# Patient Record
Sex: Female | Born: 1970
Health system: Southern US, Community
[De-identification: ages and names within clinical notes are randomized; demographics above are authoritative.]

## PROBLEM LIST (undated history)

## (undated) DIAGNOSIS — Z8742 Personal history of other diseases of the female genital tract: Secondary | ICD-10-CM

## (undated) DIAGNOSIS — N390 Urinary tract infection, site not specified: Secondary | ICD-10-CM

## (undated) DIAGNOSIS — R7303 Prediabetes: Secondary | ICD-10-CM

## (undated) DIAGNOSIS — T7840XA Allergy, unspecified, initial encounter: Secondary | ICD-10-CM

## (undated) DIAGNOSIS — M7540 Impingement syndrome of unspecified shoulder: Secondary | ICD-10-CM

## (undated) DIAGNOSIS — Z87440 Personal history of urinary (tract) infections: Secondary | ICD-10-CM

## (undated) DIAGNOSIS — E781 Pure hyperglyceridemia: Secondary | ICD-10-CM

## (undated) DIAGNOSIS — K219 Gastro-esophageal reflux disease without esophagitis: Secondary | ICD-10-CM

## (undated) DIAGNOSIS — G43909 Migraine, unspecified, not intractable, without status migrainosus: Secondary | ICD-10-CM

## (undated) DIAGNOSIS — U071 COVID-19: Secondary | ICD-10-CM

## (undated) DIAGNOSIS — E790 Hyperuricemia without signs of inflammatory arthritis and tophaceous disease: Secondary | ICD-10-CM

## (undated) DIAGNOSIS — Z9289 Personal history of other medical treatment: Secondary | ICD-10-CM

## (undated) HISTORY — DX: Personal history of other diseases of the female genital tract: Z87.42

## (undated) HISTORY — DX: Allergy, unspecified, initial encounter: T78.40XA

## (undated) HISTORY — DX: Personal history of other medical treatment: Z92.89

## (undated) HISTORY — PX: INTRAUTERINE DEVICE (IUD) INSERTION: SHX5877

## (undated) HISTORY — PX: COLONOSCOPY: SHX174

## (undated) HISTORY — DX: Urinary tract infection, site not specified: N39.0

## (undated) HISTORY — DX: Migraine, unspecified, not intractable, without status migrainosus: G43.909

---

## 1998-03-28 HISTORY — PX: SEPTOPLASTY: SUR1290

## 2004-12-10 ENCOUNTER — Ambulatory Visit: Payer: Self-pay | Admitting: Family Medicine

## 2006-09-01 ENCOUNTER — Ambulatory Visit: Payer: Self-pay | Admitting: Obstetrics and Gynecology

## 2008-05-06 ENCOUNTER — Ambulatory Visit: Payer: Self-pay | Admitting: Urology

## 2008-05-08 ENCOUNTER — Ambulatory Visit: Payer: Self-pay | Admitting: Urology

## 2009-08-19 ENCOUNTER — Ambulatory Visit: Payer: Self-pay

## 2009-10-09 ENCOUNTER — Other Ambulatory Visit: Admission: RE | Admit: 2009-10-09 | Discharge: 2009-10-09 | Payer: Self-pay | Admitting: Obstetrics and Gynecology

## 2010-12-31 ENCOUNTER — Ambulatory Visit: Payer: Self-pay | Admitting: Gastroenterology

## 2011-03-29 LAB — HM COLONOSCOPY

## 2011-09-09 ENCOUNTER — Ambulatory Visit: Payer: Self-pay | Admitting: General Practice

## 2011-09-26 ENCOUNTER — Ambulatory Visit: Payer: Self-pay | Admitting: General Practice

## 2011-12-02 HISTORY — PX: COLPOSCOPY: SHX161

## 2012-06-08 ENCOUNTER — Ambulatory Visit: Payer: Self-pay | Admitting: Obstetrics and Gynecology

## 2012-06-12 ENCOUNTER — Ambulatory Visit: Payer: Self-pay | Admitting: Obstetrics and Gynecology

## 2012-09-25 LAB — HM MAMMOGRAPHY

## 2012-09-25 LAB — HM PAP SMEAR

## 2013-03-29 ENCOUNTER — Encounter (INDEPENDENT_AMBULATORY_CARE_PROVIDER_SITE_OTHER): Payer: Self-pay

## 2013-03-29 ENCOUNTER — Ambulatory Visit (INDEPENDENT_AMBULATORY_CARE_PROVIDER_SITE_OTHER): Payer: 59 | Admitting: Adult Health

## 2013-03-29 ENCOUNTER — Encounter: Payer: Self-pay | Admitting: Adult Health

## 2013-03-29 VITALS — BP 116/72 | HR 74 | Temp 98.3°F | Resp 12 | Ht 68.5 in | Wt 254.0 lb

## 2013-03-29 DIAGNOSIS — Z0001 Encounter for general adult medical examination with abnormal findings: Secondary | ICD-10-CM | POA: Insufficient documentation

## 2013-03-29 DIAGNOSIS — K219 Gastro-esophageal reflux disease without esophagitis: Secondary | ICD-10-CM | POA: Insufficient documentation

## 2013-03-29 DIAGNOSIS — R519 Headache, unspecified: Secondary | ICD-10-CM | POA: Insufficient documentation

## 2013-03-29 DIAGNOSIS — Z0000 Encounter for general adult medical examination without abnormal findings: Secondary | ICD-10-CM | POA: Insufficient documentation

## 2013-03-29 DIAGNOSIS — R51 Headache: Secondary | ICD-10-CM

## 2013-03-29 DIAGNOSIS — Z87440 Personal history of urinary (tract) infections: Secondary | ICD-10-CM

## 2013-03-29 NOTE — Assessment & Plan Note (Signed)
Patient has been seen by urology. She is currently on Macrobid 100 mg when necessary. No symptoms at current time. Continue to follow

## 2013-03-29 NOTE — Assessment & Plan Note (Signed)
Migraine-like headache. Describes vice-like pressure, photophobia, nausea. Reports headaches began in early 103s. Mother with  similar history. Patient reports less than 5 episodes. She has treated these with over-the-counter medications such as Excedrin Migraine, ibuprofen, and Goody powders. Patient has not had MRI in the past. She would like to hold off on having this done at the present time since she really does not have frequent symptoms. Suggested keeping a food diary when headaches occur to see if she can have a correlation between foods as triggers. Also may be hormonal related. Continue to follow

## 2013-03-29 NOTE — Patient Instructions (Signed)
.   Thank you for choosing [facility] for your health care needs.  Please have your labs drawn at your earliest convenience. You will need to be fasting prior. You may drink water.  The results will be available through MyChart for your convenience. Please remember to activate this. The activation code is located at the end of this form.  I will see you back in 1 year for your physical or sooner if necessary.

## 2013-03-29 NOTE — Assessment & Plan Note (Signed)
Well controlled with omeprazole 40 mg daily. Occasionally she takes tums. Continue to follow

## 2013-03-29 NOTE — Assessment & Plan Note (Signed)
Normal physical exam. Check labs : CBC with differential, comprehensive metabolic panel, lipids, TSH, vitamin D, B12. She will have labs drawn at work. Request medical records from previous PCP. Patient was followed by Dr. Lovie Macadamia at [facility].

## 2013-03-29 NOTE — Progress Notes (Signed)
Pre visit review using our clinic review tool, if applicable. No additional management support is needed unless otherwise documented below in the visit note. 

## 2013-03-29 NOTE — Progress Notes (Signed)
Subjective:    Patient ID: Susan Bentley, female    DOB: September 24, 1970, 43 y.o.   MRN: [id number]  HPI  Susan Bentley is a pleasant 43 y/o female who presents to clinic to establish care. She was previously followed by Dr. Lovie Macadamia at [facility]. Will request records. Overall, patient is feeling well.  Hx of Migraine-like headache. Describes vice-like pressure, photophobia, nausea. Reports headaches began in early 86s. Mother with  similar history. Patient reports less than 5 episodes total. She has treated these with over-the-counter medications such as Excedrin Migraine, ibuprofen, and Goody powders.   Hx of frequent UTIs. Followed in the past by urology with workup done. Macrodantin ordered prn.    Past Medical History  Diagnosis Date  . Allergy     Claritin and Flonas helps manage symptoms  . Migraine   . UTI (lower urinary tract infection)     Seen by urology. Macrobid prn     Past Surgical History  Procedure Laterality Date  . Septoplasty  2000     Family History  Problem Relation Age of Onset  . Cancer Maternal Grandmother 36    colon cancer, breast cancer  . Hyperlipidemia Paternal Grandmother   . Hypertension Paternal Grandmother   . Heart disease Paternal Grandmother   . Cancer Paternal Grandmother   . Hyperlipidemia Paternal Grandfather   . Stroke Paternal Grandfather   . Hypertension Paternal Grandfather   . Aneurysm Paternal Grandfather   . Hypothyroidism Father   . Hypothyroidism Sister      History   Social History  . Marital Status: Married    Spouse Name: Susan Bentley    Number of Children: 2  . Years of Education: 16   Occupational History  . RN - [employer]   Social History Main Topics  . Smoking status: Never Smoker   . Smokeless tobacco: Never Used  . Alcohol Use: No  . Drug Use: No  . Sexual Activity: Yes   Other Topics Concern  . Not on file   Social History Narrative   Susan Bentley grew up in [place]. She lives at home with  her husband, Susan Bentley, of 3 years. Her youngest son, Susan Bentley, is a Paramedic in [employer] and lives at home with them. [name] has another son, Susan Bentley, who is away at college at [employer]. [name] attended [employer] and obtained her Bachelors in Nursing. 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 She also enjoys gardening in the summer. She also quilts.     Review of Systems  Constitutional: Negative.   HENT: Negative.   Eyes: Negative.   Respiratory: Negative.   Cardiovascular: Negative.   Gastrointestinal: Negative.   Endocrine: Negative.   Genitourinary: Negative.   Musculoskeletal: Negative.   Skin: Negative.   Allergic/Immunologic: Negative.   Neurological: Negative.   Hematological: Negative.   Psychiatric/Behavioral: Negative.        Objective:   Physical Exam  Constitutional: She is oriented to person, place, and time. She appears well-developed and well-nourished. No distress.  HENT:  Head: Normocephalic and atraumatic.  Right Ear: External ear normal.  Left Ear: External ear normal.  Nose: Nose normal.  Mouth/Throat: Oropharynx is clear and moist.  Eyes: Conjunctivae and EOM are normal. Pupils are equal, round, and reactive to light.  Neck: Normal range of motion. Neck supple. No tracheal deviation present. No thyromegaly present.  Cardiovascular: Normal rate, regular rhythm, normal heart sounds and intact distal  pulses.  Exam reveals no gallop and no friction rub.   No murmur heard. Pulmonary/Chest: Effort normal and breath sounds normal. No respiratory distress. She has no wheezes. She has no rales.  Abdominal: Soft. Bowel sounds are normal. She exhibits no distension and no mass. There is no tenderness. There is no rebound and no guarding.  Genitourinary:  Deferred. Followed by [facility]  Musculoskeletal: Normal range of motion. She exhibits no edema and no tenderness.  Lymphadenopathy:    She has no cervical adenopathy.    Neurological: She is alert and oriented to person, place, and time. She has normal reflexes. No cranial nerve deficit. Coordination normal.  Skin: Skin is warm and dry.  Psychiatric: She has a normal mood and affect. Her behavior is normal. Judgment and thought content normal.    BP 116/72  Pulse 74  Temp(Src) 98.3 F (36.8 C) (Oral)  Resp 12  Ht 5' 8.5" (1.74 m)  Wt 254 lb (115.214 kg)  BMI 38.05 kg/m2  SpO2 98%        Assessment & Plan:

## 2013-04-02 ENCOUNTER — Other Ambulatory Visit: Payer: Self-pay | Admitting: Adult Health

## 2013-04-16 LAB — COMPREHENSIVE METABOLIC PANEL
ALT: 18 IU/L (ref 0–32)
AST: 16 IU/L (ref 0–40)
Albumin/Globulin Ratio: 1.7 (ref 1.1–2.5)
Albumin: 4.1 g/dL (ref 3.5–5.5)
Alkaline Phosphatase: 91 IU/L (ref 39–117)
BUN/Creatinine Ratio: 19 (ref 9–23)
BUN: 14 mg/dL (ref 6–24)
CALCIUM: 9.3 mg/dL (ref 8.7–10.2)
CHLORIDE: 103 mmol/L (ref 97–108)
CO2: 17 mmol/L — AB (ref 18–29)
Creatinine, Ser: 0.72 mg/dL (ref 0.57–1.00)
GFR calc Af Amer: 119 mL/min/{1.73_m2} (ref >59)
GFR calc non Af Amer: 103 mL/min/{1.73_m2} (ref >59)
GLUCOSE: 91 mg/dL (ref 65–99)
Globulin, Total: 2.4 g/dL (ref 1.5–4.5)
POTASSIUM: 4.7 mmol/L (ref 3.5–5.2)
Sodium: 143 mmol/L (ref 134–144)
TOTAL PROTEIN: 6.5 g/dL (ref 6.0–8.5)
Total Bilirubin: 0.3 mg/dL (ref 0.0–1.2)

## 2013-04-16 LAB — CBC WITH DIFFERENTIAL
BASOS ABS: 0 10*3/uL (ref 0.0–0.2)
BASOS: 1 %
Eos: 1 %
Eosinophils Absolute: 0.1 10*3/uL (ref 0.0–0.4)
HCT: 38.5 % (ref 34.0–46.6)
HEMOGLOBIN: 12.7 g/dL (ref 11.1–15.9)
IMMATURE GRANULOCYTES: 0 %
Immature Grans (Abs): 0 10*3/uL (ref 0.0–0.1)
Lymphocytes Absolute: 1.7 10*3/uL (ref 0.7–3.1)
Lymphs: 30 %
MCH: 27.1 pg (ref 26.6–33.0)
MCHC: 33 g/dL (ref 31.5–35.7)
MCV: 82 fL (ref 79–97)
Monocytes Absolute: 0.5 10*3/uL (ref 0.1–0.9)
Monocytes: 8 %
NEUTROS PCT: 60 %
Neutrophils Absolute: 3.5 10*3/uL (ref 1.4–7.0)
Platelets: 382 10*3/uL — ABNORMAL HIGH (ref 150–379)
RBC: 4.69 x10E6/uL (ref 3.77–5.28)
RDW: 14.3 % (ref 12.3–15.4)
WBC: 5.8 10*3/uL (ref 3.4–10.8)

## 2013-04-16 LAB — LIPID PANEL WITH LDL/HDL RATIO
CHOLESTEROL TOTAL: 203 mg/dL — AB (ref 100–199)
HDL: 33 mg/dL — AB (ref >39)
LDL Calculated: 137 mg/dL — ABNORMAL HIGH (ref 0–99)
LDl/HDL Ratio: 4.2 ratio units — ABNORMAL HIGH (ref 0.0–3.2)
Triglycerides: 166 mg/dL — ABNORMAL HIGH (ref 0–149)
VLDL Cholesterol Cal: 33 mg/dL (ref 5–40)

## 2013-04-16 LAB — TSH: TSH: 2.29 u[IU]/mL (ref 0.450–4.500)

## 2013-04-16 LAB — B12 AND FOLATE PANEL
Folate: 13.1 ng/mL (ref >3.0)
Vitamin B-12: 373 pg/mL (ref 211–946)

## 2013-04-16 LAB — VITAMIN D 25 HYDROXY (VIT D DEFICIENCY, FRACTURES): Vit D, 25-Hydroxy: 25.4 ng/mL — ABNORMAL LOW (ref 30.0–100.0)

## 2013-04-17 ENCOUNTER — Encounter: Payer: Self-pay | Admitting: Adult Health

## 2013-06-21 ENCOUNTER — Ambulatory Visit: Payer: Self-pay | Admitting: Adult Health

## 2013-07-09 ENCOUNTER — Encounter: Payer: Self-pay | Admitting: Adult Health

## 2014-04-04 ENCOUNTER — Ambulatory Visit (INDEPENDENT_AMBULATORY_CARE_PROVIDER_SITE_OTHER): Payer: 59 | Admitting: Nurse Practitioner

## 2014-04-04 ENCOUNTER — Encounter: Payer: Self-pay | Admitting: Nurse Practitioner

## 2014-04-04 VITALS — BP 120/72 | HR 97 | Temp 98.1°F | Resp 12 | Ht 68.0 in | Wt 253.8 lb

## 2014-04-04 DIAGNOSIS — Z1329 Encounter for screening for other suspected endocrine disorder: Secondary | ICD-10-CM

## 2014-04-04 DIAGNOSIS — K219 Gastro-esophageal reflux disease without esophagitis: Secondary | ICD-10-CM

## 2014-04-04 DIAGNOSIS — Z0000 Encounter for general adult medical examination without abnormal findings: Secondary | ICD-10-CM

## 2014-04-04 DIAGNOSIS — Z13220 Encounter for screening for lipoid disorders: Secondary | ICD-10-CM

## 2014-04-04 DIAGNOSIS — Z131 Encounter for screening for diabetes mellitus: Secondary | ICD-10-CM

## 2014-04-04 DIAGNOSIS — R51 Headache: Secondary | ICD-10-CM

## 2014-04-04 DIAGNOSIS — R519 Headache, unspecified: Secondary | ICD-10-CM

## 2014-04-04 DIAGNOSIS — Z87440 Personal history of urinary (tract) infections: Secondary | ICD-10-CM

## 2014-04-04 LAB — COMPREHENSIVE METABOLIC PANEL
ALBUMIN: 3.8 g/dL (ref 3.5–5.2)
ALT: 23 U/L (ref 0–35)
AST: 18 U/L (ref 0–37)
Alkaline Phosphatase: 92 U/L (ref 39–117)
BUN: 12 mg/dL (ref 6–23)
CALCIUM: 9.2 mg/dL (ref 8.4–10.5)
CO2: 28 meq/L (ref 19–32)
CREATININE: 0.8 mg/dL (ref 0.4–1.2)
Chloride: 103 mEq/L (ref 96–112)
GFR: 89.04 mL/min (ref >60.00)
Glucose, Bld: 87 mg/dL (ref 70–99)
Potassium: 4.4 mEq/L (ref 3.5–5.1)
SODIUM: 139 meq/L (ref 135–145)
TOTAL PROTEIN: 6.8 g/dL (ref 6.0–8.3)
Total Bilirubin: 0.4 mg/dL (ref 0.2–1.2)

## 2014-04-04 LAB — HEMOGLOBIN A1C: Hgb A1c MFr Bld: 6.1 % (ref 4.6–6.5)

## 2014-04-04 LAB — LIPID PANEL
CHOLESTEROL: 182 mg/dL (ref 0–200)
HDL: 24.5 mg/dL — ABNORMAL LOW (ref >39.00)
NONHDL: 157.5
Total CHOL/HDL Ratio: 7
Triglycerides: 218 mg/dL — ABNORMAL HIGH (ref 0.0–149.0)
VLDL: 43.6 mg/dL — ABNORMAL HIGH (ref 0.0–40.0)

## 2014-04-04 LAB — TSH: TSH: 1.73 u[IU]/mL (ref 0.35–4.50)

## 2014-04-04 LAB — LDL CHOLESTEROL, DIRECT: Direct LDL: 111.5 mg/dL

## 2014-04-04 MED ORDER — FLUTICASONE PROPIONATE 50 MCG/ACT NA SUSP: 1.0000 | Freq: Every day | NASAL | Status: DC

## 2014-04-04 MED ORDER — OMEPRAZOLE 40 MG PO CPDR: 40.0000 mg | DELAYED_RELEASE_CAPSULE | Freq: Every day | ORAL | Status: DC

## 2014-04-04 MED ORDER — NITROFURANTOIN MACROCRYSTAL 100 MG PO CAPS: 100.0000 mg | ORAL_CAPSULE | Freq: Every day | ORAL | Status: DC | PRN

## 2014-04-04 MED ORDER — URIBEL 118 MG PO CAPS: 118.0000 mg | ORAL_CAPSULE | Freq: Every day | ORAL | Status: DC | PRN

## 2014-04-04 NOTE — Progress Notes (Signed)
Subjective:    Patient ID: Susan Bentley, female    DOB: [birth year], 44 y.o.   MRN: [id number]  HPI  Ms. Liverman is a 44 yo female here for her Annual Physical.   1) Requesting refills of flonase, uribel, macrodantin, and prilosec.    Macrodantin- seen by Dr. Bernardo Heater with [facility] in [place]. Low dose consistent use of this has helped her not have recurrent UTI's. She denies any symptoms of UTI today.     Prilosec- Reflux  Reflux okay, does not take prilosec every day   Uses flonase regularly   No concerns with HA recently.   Mother recently dx with diabetes and she is concerned about diabetes as well. She would like to be tested.   Health Maintenance reviewed and topics are updated in that tab.   Review of Systems  Constitutional: Negative for fever, chills, diaphoresis, fatigue and unexpected weight change.  HENT: Negative for tinnitus and trouble swallowing.   Eyes: Negative for visual disturbance.  Respiratory: Negative for cough, chest tightness, shortness of breath and wheezing.   Cardiovascular: Negative for chest pain, palpitations and leg swelling.  Gastrointestinal: Negative for nausea, vomiting, abdominal pain, diarrhea, constipation and blood in stool.  Endocrine: Negative for polydipsia, polyphagia and polyuria.  Genitourinary: Negative for dysuria, hematuria, vaginal discharge and vaginal pain.  Musculoskeletal: Negative for myalgias, back pain, arthralgias and gait problem.  Skin: Negative for color change and rash.  Neurological: Negative for dizziness, weakness, numbness and headaches.  Hematological: Does not bruise/bleed easily.  Psychiatric/Behavioral: Negative for suicidal ideas and sleep disturbance. The patient is not nervous/anxious.    Past Medical History  Diagnosis Date  . Allergy     Claritin and Flonas helps manage symptoms  . Migraine   . UTI (lower urinary tract infection)     Seen by urology. Macrobid prn    History   Social  History  . Marital Status: Married    Spouse Name: [name]    Number of Children: 2  . Years of Education: 16   Occupational History  . RN - [employer]   Social History Main Topics  . Smoking status: Never Smoker   . Smokeless tobacco: Never Used  . Alcohol Use: No  . Drug Use: No  . Sexual Activity: Yes   Other Topics Concern  . Not on file   Social History Narrative   Dalexa grew up in [place]. She lives at home with her husband, [name], of 3 years. Her youngest son, [name], is a Paramedic in [employer] and lives at home with them. Rosealynn has another son, [name], who is away at college at [employer]. Michela attended [employer] and obtained her Bachelors in Nursing. 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 She also enjoys gardening in the summer. She also quilts.    Past Surgical History  Procedure Laterality Date  . Septoplasty  2000    Family History  Problem Relation Age of Onset  . Cancer Maternal Grandmother 36    colon cancer, breast cancer  . Hyperlipidemia Paternal Grandmother   . Hypertension Paternal Grandmother   . Heart disease Paternal Grandmother   . Cancer Paternal Grandmother   . Hyperlipidemia Paternal Grandfather   . Stroke Paternal Grandfather   . Hypertension Paternal Grandfather   . Aneurysm Paternal Grandfather   . Hypothyroidism Father   . Hypothyroidism Sister     Allergies  Allergen  Reactions  . Codeine Hives  . Sulfa Antibiotics Other (See Comments)    Dizzy and lightheadedness    Current Outpatient Prescriptions on File Prior to Visit  Medication Sig Dispense Refill  . levonorgestrel (MIRENA) 20 MCG/24HR IUD 1 each by Intrauterine route once.     No current facility-administered medications on file prior to visit.       Objective:   Physical Exam  Constitutional: She is oriented to person, place, and time. She appears well-developed and well-nourished. No distress.    HENT:  Head: Normocephalic and atraumatic.  Right Ear: External ear normal.  Left Ear: External ear normal.  Nose: Nose normal.  Mouth/Throat: Oropharynx is clear and moist. No oropharyngeal exudate.  TMs and canals clear bilaterally  Eyes: Conjunctivae and EOM are normal. Pupils are equal, round, and reactive to light. Right eye exhibits no discharge. Left eye exhibits no discharge. No scleral icterus.  Neck: Normal range of motion. Neck supple. No thyromegaly present.  Cardiovascular: Normal rate, regular rhythm, normal heart sounds and intact distal pulses.  Exam reveals no gallop and no friction rub.   No murmur heard. Pulmonary/Chest: Effort normal and breath sounds normal. No respiratory distress. She has no wheezes. She has no rales. She exhibits no tenderness.  Abdominal: Soft. Bowel sounds are normal. She exhibits no distension and no mass. There is no tenderness. There is no rebound and no guarding.  Musculoskeletal: Normal range of motion. She exhibits no edema or tenderness.  Lymphadenopathy:    She has no cervical adenopathy.  Neurological: She is alert and oriented to person, place, and time. She has normal reflexes. No cranial nerve deficit. She exhibits normal muscle tone. Coordination normal.  Skin: Skin is warm and dry. No rash noted. She is not diaphoretic. No erythema. No pallor.  Psychiatric: She has a normal mood and affect. Her behavior is normal. Judgment and thought content normal.   BP 120/72 mmHg  Pulse 97  Temp(Src) 98.1 F (36.7 C) (Oral)  Resp 12  Ht 5\' 8"  (1.727 m)  Wt 253 lb 12.8 oz (115.123 kg)  BMI 38.60 kg/m2  SpO2 98%     Assessment & Plan:

## 2014-04-04 NOTE — Patient Instructions (Signed)

## 2014-04-04 NOTE — Progress Notes (Signed)
Pre visit review using our clinic review tool, if applicable. No additional management support is needed unless otherwise documented below in the visit note. 

## 2014-04-07 NOTE — Assessment & Plan Note (Signed)
Stable on Nitrofurantoin 100 mg prn. Sees Urology.

## 2014-04-07 NOTE — Assessment & Plan Note (Addendum)
Physical exam with labs today. Up to date on health maintenance required for her age group. Obtain TSH, lipids, A1c, and CMET.

## 2014-04-07 NOTE — Assessment & Plan Note (Signed)
Stable on prn Omeprazole 40 mg. Fu 1 year

## 2014-04-07 NOTE — Assessment & Plan Note (Signed)
Stable. No current medications.

## 2014-05-29 ENCOUNTER — Other Ambulatory Visit: Payer: Self-pay | Admitting: Nurse Practitioner

## 2014-05-30 ENCOUNTER — Other Ambulatory Visit: Payer: Self-pay | Admitting: *Deleted

## 2014-05-30 ENCOUNTER — Other Ambulatory Visit: Payer: Self-pay | Admitting: Nurse Practitioner

## 2014-05-30 MED ORDER — NITROFURANTOIN MACROCRYSTAL 100 MG PO CAPS: 100.0000 mg | ORAL_CAPSULE | Freq: Every day | ORAL | Status: DC | PRN

## 2014-05-30 MED ORDER — FLUTICASONE PROPIONATE 50 MCG/ACT NA SUSP: 1.0000 | Freq: Every day | NASAL | Status: DC

## 2014-05-30 NOTE — Telephone Encounter (Signed)
For [name]- Macrodantin 100 mg dispense # 30, 1 refill. Thanks!

## 2014-05-30 NOTE — Addendum Note (Signed)
Addended by: Wynonia Lawman E on: 05/30/2014 01:55 PM   Modules accepted: Orders

## 2014-06-27 ENCOUNTER — Ambulatory Visit
Admit: 2014-06-27 | Disposition: A | Discharge status: Home / Self Care | Payer: Self-pay | Attending: Nurse Practitioner | Admitting: Nurse Practitioner

## 2014-10-13 DIAGNOSIS — Z9289 Personal history of other medical treatment: Secondary | ICD-10-CM

## 2014-10-13 HISTORY — DX: Personal history of other medical treatment: Z92.89

## 2014-10-13 LAB — HM PAP SMEAR: HM PAP: NEGATIVE

## 2015-06-29 ENCOUNTER — Ambulatory Visit (INDEPENDENT_AMBULATORY_CARE_PROVIDER_SITE_OTHER): Payer: 59 | Admitting: Nurse Practitioner

## 2015-06-29 ENCOUNTER — Encounter: Payer: Self-pay | Admitting: Nurse Practitioner

## 2015-06-29 VITALS — BP 114/82 | HR 80 | Temp 98.2°F | Ht 67.25 in | Wt 251.0 lb

## 2015-06-29 DIAGNOSIS — Z1239 Encounter for other screening for malignant neoplasm of breast: Secondary | ICD-10-CM | POA: Diagnosis not present

## 2015-06-29 DIAGNOSIS — Z0000 Encounter for general adult medical examination without abnormal findings: Secondary | ICD-10-CM

## 2015-06-29 MED ORDER — FLUTICASONE PROPIONATE 50 MCG/ACT NA SUSP: 1.0000 | Freq: Every day | NASAL | Status: DC

## 2015-06-29 MED ORDER — NITROFURANTOIN MACROCRYSTAL 100 MG PO CAPS
100.0000 mg | ORAL_CAPSULE | Freq: Every day | ORAL | Status: DC | PRN
Start: 2015-06-29 — End: 2016-07-19

## 2015-06-29 MED ORDER — OMEPRAZOLE 40 MG PO CPDR: 40.0000 mg | DELAYED_RELEASE_CAPSULE | Freq: Every day | ORAL | Status: DC

## 2015-06-29 MED ORDER — URIBEL 118 MG PO CAPS: 118.0000 mg | ORAL_CAPSULE | Freq: Every day | ORAL | Status: DC | PRN

## 2015-06-29 NOTE — Progress Notes (Signed)
Patient ID: Susan Bentley, female    DOB: 1971-01-02  Age: 45 y.o. MRN: [id number]  CC: Annual Exam   HPI Susan Bentley presents for CPE with labs and breast exam; without PAP.   1) Annual Physical   Diet- Eating healthier   Exercise- Plantar fasciitis- shots helpful   Immunizations-    Flu- UTD   Tdap- [name]  Mammogram- Needs order  Eye Exam- Glasses- UTD  Dental Exam- UTD  LMP- IUD, increased breast tenderness and spotting for a week every few weeks.   Labs- Fasting labs     Depression- Neg.  Refills: 90 day supply to [facility] has a past medical history of Allergy; Migraine; and UTI (lower urinary tract infection).   She has past surgical history that includes Septoplasty (2000).   Her family history includes Aneurysm in her paternal grandfather; Cancer in her paternal grandmother; Cancer (age of onset: 52) in her maternal grandmother; Heart disease in her paternal grandmother; Hyperlipidemia in her paternal grandfather and paternal grandmother; Hypertension in her paternal grandfather and paternal grandmother; Hypothyroidism in her father and sister; Stroke in her paternal grandfather.She reports that she has never smoked. She has never used smokeless tobacco. She reports that she does not drink alcohol or use illicit drugs.  Outpatient Prescriptions Prior to Visit  Medication Sig Dispense Refill  . levonorgestrel (MIRENA) 20 MCG/24HR IUD 1 each by Intrauterine route once.    . fluticasone (FLONASE) 50 MCG/ACT nasal spray Place 1 spray into both nostrils daily. 16 g 3  . Meth-Hyo-M Bl-Na Phos-Ph Sal (URIBEL) 118 MG CAPS Take 1 capsule (118 mg total) by mouth daily as needed. 120 capsule 0  . nitrofurantoin (MACRODANTIN) 100 MG capsule Take 1 capsule (100 mg total) by mouth daily as needed. 30 capsule 1  . omeprazole (PRILOSEC) 40 MG capsule Take 1 capsule (40 mg total) by mouth daily. 30 capsule 5   No facility-administered medications prior to visit.     ROS Review of Systems  Constitutional: Negative for fever, chills, diaphoresis, fatigue and unexpected weight change.  HENT: Negative for tinnitus and trouble swallowing.   Eyes: Negative for visual disturbance.  Respiratory: Negative for chest tightness, shortness of breath and wheezing.   Cardiovascular: Negative for chest pain, palpitations and leg swelling.  Gastrointestinal: Negative for nausea, vomiting, abdominal pain, diarrhea, constipation and blood in stool.  Endocrine: Negative for polydipsia, polyphagia and polyuria.  Genitourinary: Negative for dysuria, hematuria, vaginal discharge and vaginal pain.  Musculoskeletal: Negative for myalgias, back pain, arthralgias and gait problem.  Skin: Negative for color change and rash.  Neurological: Negative for dizziness, weakness, numbness and headaches.  Hematological: Does not bruise/bleed easily.  Psychiatric/Behavioral: Negative for suicidal ideas and sleep disturbance. The patient is not nervous/anxious.     Objective:  BP 114/82 mmHg  Pulse 80  Temp(Src) 98.2 F (36.8 C) (Oral)  Ht 5' 7.25" (1.708 m)  Wt 251 lb (113.853 kg)  BMI 39.03 kg/m2  SpO2 96%  Physical Exam  Constitutional: She is oriented to person, place, and time. She appears well-developed and well-nourished. No distress.  HENT:  Head: Normocephalic and atraumatic.  Right Ear: External ear normal.  Left Ear: External ear normal.  Nose: Nose normal.  Mouth/Throat: Oropharynx is clear and moist. No oropharyngeal exudate.  TMs and canals clear bilaterally  Eyes: Conjunctivae and EOM are normal. Pupils are equal, round, and reactive to light. Right eye exhibits no discharge. Left eye exhibits no discharge. No scleral  icterus.  Neck: Normal range of motion. Neck supple. No thyromegaly present.  Cardiovascular: Normal rate, regular rhythm, normal heart sounds and intact distal pulses.  Exam reveals no gallop and no friction rub.   No murmur  heard. Pulmonary/Chest: Effort normal and breath sounds normal. No respiratory distress. She has no wheezes. She has no rales. She exhibits no tenderness.  Breast exam- large pendulous breast tissue  Abdominal: Soft. Bowel sounds are normal. She exhibits no distension and no mass. There is no tenderness. There is no rebound and no guarding.  Genitourinary:  Deferred to Ob/GYN  Musculoskeletal: Normal range of motion. She exhibits no edema or tenderness.  Lymphadenopathy:    She has no cervical adenopathy.  Neurological: She is alert and oriented to person, place, and time. She has normal reflexes. No cranial nerve deficit. She exhibits normal muscle tone. Coordination normal.  Skin: Skin is warm and dry. No rash noted. She is not diaphoretic. No erythema. No pallor.  Psychiatric: She has a normal mood and affect. Her behavior is normal. Judgment and thought content normal.      Assessment & Plan:   Susan Bentley was seen today for annual exam.  Diagnoses and all orders for this visit:  Routine general medical examination at a health care facility -     CBC with Differential/Platelet; Future -     Comprehensive metabolic panel; Future -     Hemoglobin A1c; Future -     Lipid panel; Future  Screening for breast cancer -     MM SCREENING BREAST TOMO BILATERAL; Future  Other orders -     fluticasone (FLONASE) 50 MCG/ACT nasal spray; Place 1 spray into both nostrils daily. -     nitrofurantoin (MACRODANTIN) 100 MG capsule; Take 1 capsule (100 mg total) by mouth daily as needed. -     Meth-Hyo-M Bl-Na Phos-Ph Sal (URIBEL) 118 MG CAPS; Take 1 capsule (118 mg total) by mouth daily as needed. -     omeprazole (PRILOSEC) 40 MG capsule; Take 1 capsule (40 mg total) by mouth daily.   I am having Susan Bentley maintain her levonorgestrel, fluticasone, nitrofurantoin, URIBEL, and omeprazole.  Meds ordered this encounter  Medications  . fluticasone (FLONASE) 50 MCG/ACT nasal spray    Sig: Place 1  spray into both nostrils daily.    Dispense:  16 g    Refill:  11    Order Specific Question:  Supervising Provider    Answer:  Deborra Medina L [[id number]]  . nitrofurantoin (MACRODANTIN) 100 MG capsule    Sig: Take 1 capsule (100 mg total) by mouth daily as needed.    Dispense:  90 capsule    Refill:  1    Order Specific Question:  Supervising Provider    Answer:  Derrel Nip, TERESA L [[id number]]  . Meth-Hyo-M Bl-Na Phos-Ph Sal (URIBEL) 118 MG CAPS    Sig: Take 1 capsule (118 mg total) by mouth daily as needed.    Dispense:  90 capsule    Refill:  3    Order Specific Question:  Supervising Provider    Answer:  Deborra Medina L [[id number]]  . omeprazole (PRILOSEC) 40 MG capsule    Sig: Take 1 capsule (40 mg total) by mouth daily.    Dispense:  90 capsule    Refill:  3    Order Specific Question:  Supervising Provider    Answer:  Crecencio Mc [[id number]]     Follow-up: Return in about 1 year (around 06/28/2016)  for CPE.

## 2015-06-29 NOTE — Patient Instructions (Addendum)
[facility]  Address: [address] Madelaine Bhat [place], [place] [address]  Phone: [phone] Hours:  Monday - Thursday: 8 a.m. - 5 p.m. Friday: 8 a.m. - 3 p.m.  Please make a fasting lab appointment before leaving today. See you in 1 year or sooner if needed.   Menopause is a normal process in which your reproductive ability comes to an end. This process happens gradually over a span of months to years, usually between the ages of 49 and 68. Menopause is complete when you have missed 12 consecutive menstrual periods. It is important to talk with your health care provider about some of the most common conditions that affect postmenopausal women, such as heart disease, cancer, and bone loss (osteoporosis). Adopting a healthy lifestyle and getting preventive care can help to promote your health and wellness. Those actions can also lower your chances of developing some of these common conditions. WHAT SHOULD I KNOW ABOUT MENOPAUSE? During menopause, you may experience a number of symptoms, such as:  Moderate-to-severe hot flashes.  Night sweats.  Decrease in sex drive.  Mood swings.  Headaches.  Tiredness.  Irritability.  Memory problems.  Insomnia. Choosing to treat or not to treat menopausal changes is an individual decision that you make with your health care provider. WHAT SHOULD I KNOW ABOUT HORMONE REPLACEMENT THERAPY AND SUPPLEMENTS? Hormone therapy products are effective for treating symptoms that are associated with menopause, such as hot flashes and night sweats. Hormone replacement carries certain risks, especially as you become older. If you are thinking about using estrogen or estrogen with progestin treatments, discuss the benefits and risks with your health care provider. WHAT SHOULD I KNOW ABOUT HEART DISEASE AND STROKE? Heart disease, heart attack, and stroke become more likely as you age. This may be due, in part, to the hormonal changes  that your body experiences during menopause. These can affect how your body processes dietary fats, triglycerides, and cholesterol. Heart attack and stroke are both medical emergencies. There are many things that you can do to help prevent heart disease and stroke:  Have your blood pressure checked at least every 1-2 years. High blood pressure causes heart disease and increases the risk of stroke.  If you are 57-5 years old, ask your health care provider if you should take aspirin to prevent a heart attack or a stroke.  Do not use any tobacco products, including cigarettes, chewing tobacco, or electronic cigarettes. If you need help quitting, ask your health care provider.  It is important to eat a healthy diet and maintain a healthy weight.  Be sure to include plenty of vegetables, fruits, low-fat dairy products, and lean protein.  Avoid eating foods that are high in solid fats, added sugars, or salt (sodium).  Get regular exercise. This is one of the most important things that you can do for your health.  Try to exercise for at least 150 minutes each week. The type of exercise that you do should increase your heart rate and make you sweat. This is known as moderate-intensity exercise.  Try to do strengthening exercises at least twice each week. Do these in addition to the moderate-intensity exercise.  Know your numbers.Ask your health care provider to check your cholesterol and your blood glucose. Continue to have your blood tested as directed by your health care provider. WHAT SHOULD I KNOW ABOUT CANCER SCREENING? There are several types of cancer. Take the following steps to reduce your risk and to catch any  cancer development as early as possible. Breast Cancer  Practice breast self-awareness.  This means understanding how your breasts normally appear and feel.  It also means doing regular breast self-exams. Let your health care provider know about any changes, no matter how  small.  If you are 60 or older, have a clinician do a breast exam (clinical breast exam or CBE) every year. Depending on your age, family history, and medical history, it may be recommended that you also have a yearly breast X-ray (mammogram).  If you have a family history of breast cancer, talk with your health care provider about genetic screening.  If you are at high risk for breast cancer, talk with your health care provider about having an MRI and a mammogram every year.  Breast cancer (BRCA) gene test is recommended for women who have family members with BRCA-related cancers. Results of the assessment will determine the need for genetic counseling and BRCA1 and for BRCA2 testing. BRCA-related cancers include these types:  Breast. This occurs in males or females.  Ovarian.  Tubal. This may also be called fallopian tube cancer.  Cancer of the abdominal or pelvic lining (peritoneal cancer).  Prostate.  Pancreatic. Cervical, Uterine, and Ovarian Cancer Your health care provider may recommend that you be screened regularly for cancer of the pelvic organs. These include your ovaries, uterus, and vagina. This screening involves a pelvic exam, which includes checking for microscopic changes to the surface of your cervix (Pap test).  For women ages 21-65, health care providers may recommend a pelvic exam and a Pap test every three years. For women ages 26-65, they may recommend the Pap test and pelvic exam, combined with testing for human papilloma virus (HPV), every five years. Some types of HPV increase your risk of cervical cancer. Testing for HPV may also be done on women of any age who have unclear Pap test results.  Other health care providers may not recommend any screening for nonpregnant women who are considered low risk for pelvic cancer and have no symptoms. Ask your health care provider if a screening pelvic exam is right for you.  If you have had past treatment for cervical  cancer or a condition that could lead to cancer, you need Pap tests and screening for cancer for at least 20 years after your treatment. If Pap tests have been discontinued for you, your risk factors (such as having a new sexual partner) need to be reassessed to determine if you should start having screenings again. Some women have medical problems that increase the chance of getting cervical cancer. In these cases, your health care provider may recommend that you have screening and Pap tests more often.  If you have a family history of uterine cancer or ovarian cancer, talk with your health care provider about genetic screening.  If you have vaginal bleeding after reaching menopause, tell your health care provider.  There are currently no reliable tests available to screen for ovarian cancer. Lung Cancer Lung cancer screening is recommended for adults 81-74 years old who are at high risk for lung cancer because of a history of smoking. A yearly low-dose CT scan of the lungs is recommended if you:  Currently smoke.  Have a history of at least 30 pack-years of smoking and you currently smoke or have quit within the past 15 years. A pack-year is smoking an average of one pack of cigarettes per day for one year. Yearly screening should:  Continue until it has been  15 years since you quit.  Stop if you develop a health problem that would prevent you from having lung cancer treatment. Colorectal Cancer  This type of cancer can be detected and can often be prevented.  Routine colorectal cancer screening usually begins at age 62 and continues through age 39.  If you have risk factors for colon cancer, your health care provider may recommend that you be screened at an earlier age.  If you have a family history of colorectal cancer, talk with your health care provider about genetic screening.  Your health care provider may also recommend using home test kits to check for hidden blood in your  stool.  A small camera at the end of a tube can be used to examine your colon directly (sigmoidoscopy or colonoscopy). This is done to check for the earliest forms of colorectal cancer.  Direct examination of the colon should be repeated every 5-10 years until age 63. However, if early forms of precancerous polyps or small growths are found or if you have a family history or genetic risk for colorectal cancer, you may need to be screened more often. Skin Cancer  Check your skin from head to toe regularly.  Monitor any moles. Be sure to tell your health care provider:  About any new moles or changes in moles, especially if there is a change in a mole's shape or color.  If you have a mole that is larger than the size of a pencil eraser.  If any of your family members has a history of skin cancer, especially at a young age, talk with your health care provider about genetic screening.  Always use sunscreen. Apply sunscreen liberally and repeatedly throughout the day.  Whenever you are outside, protect yourself by wearing long sleeves, pants, a wide-brimmed hat, and sunglasses. WHAT SHOULD I KNOW ABOUT OSTEOPOROSIS? Osteoporosis is a condition in which bone destruction happens more quickly than new bone creation. After menopause, you may be at an increased risk for osteoporosis. To help prevent osteoporosis or the bone fractures that can happen because of osteoporosis, the following is recommended:  If you are 92-23 years old, get at least 1,000 mg of calcium and at least 600 mg of vitamin D per day.  If you are older than age 67 but younger than age 36, get at least 1,200 mg of calcium and at least 600 mg of vitamin D per day.  If you are older than age 7, get at least 1,200 mg of calcium and at least 800 mg of vitamin D per day. Smoking and excessive alcohol intake increase the risk of osteoporosis. Eat foods that are rich in calcium and vitamin D, and do weight-bearing exercises several  times each week as directed by your health care provider. WHAT SHOULD I KNOW ABOUT HOW MENOPAUSE AFFECTS [place]? Depression may occur at any age, but it is more common as you become older. Common symptoms of depression include:  Low or sad mood.  Changes in sleep patterns.  Changes in appetite or eating patterns.  Feeling an overall lack of motivation or enjoyment of activities that you previously enjoyed.  Frequent crying spells. Talk with your health care provider if you think that you are experiencing depression. WHAT SHOULD I KNOW ABOUT IMMUNIZATIONS? It is important that you get and maintain your immunizations. These include:  Tetanus, diphtheria, and pertussis (Tdap) booster vaccine.  Influenza every year before the flu season begins.  Pneumonia vaccine.  Shingles vaccine. Your  health care provider may also recommend other immunizations.   This information is not intended to replace advice given to you by your health care provider. Make sure you discuss any questions you have with your health care provider.   Document Released: 05/06/2005 Document Revised: 04/04/2014 Document Reviewed: 11/14/2013 Elsevier Interactive Patient Education [employer].

## 2015-06-29 NOTE — Progress Notes (Signed)
Pre visit review using our clinic review tool, if applicable. No additional management support is needed unless otherwise documented below in the visit note. 

## 2015-07-05 NOTE — Assessment & Plan Note (Signed)
Discussed acute and chronic issues. Reviewed health maintenance measures, PFSHx, and immunizations. Obtain routine labs Lipid panel, CBC w/ diff, A1c, and CMET.    

## 2015-07-09 ENCOUNTER — Other Ambulatory Visit: Payer: Self-pay | Admitting: Nurse Practitioner

## 2015-07-09 ENCOUNTER — Ambulatory Visit
Admission: RE | Admit: 2015-07-09 | Discharge: 2015-07-09 | Disposition: A | Discharge status: Home / Self Care | Payer: 59 | Source: Ambulatory Visit | Attending: Nurse Practitioner | Admitting: Nurse Practitioner

## 2015-07-09 DIAGNOSIS — Z1239 Encounter for other screening for malignant neoplasm of breast: Secondary | ICD-10-CM

## 2015-07-09 DIAGNOSIS — Z1231 Encounter for screening mammogram for malignant neoplasm of breast: Secondary | ICD-10-CM | POA: Diagnosis not present

## 2015-07-24 ENCOUNTER — Other Ambulatory Visit (INDEPENDENT_AMBULATORY_CARE_PROVIDER_SITE_OTHER): Payer: 59

## 2015-07-24 DIAGNOSIS — Z0000 Encounter for general adult medical examination without abnormal findings: Secondary | ICD-10-CM

## 2015-07-24 LAB — CBC WITH DIFFERENTIAL/PLATELET
BASOS PCT: 0.4 % (ref 0.0–3.0)
Basophils Absolute: 0 10*3/uL (ref 0.0–0.1)
Eosinophils Absolute: 0.1 10*3/uL (ref 0.0–0.7)
Eosinophils Relative: 0.9 % (ref 0.0–5.0)
HEMATOCRIT: 39.5 % (ref 36.0–46.0)
Hemoglobin: 13.2 g/dL (ref 12.0–15.0)
LYMPHS PCT: 28 % (ref 12.0–46.0)
Lymphs Abs: 1.8 10*3/uL (ref 0.7–4.0)
MCHC: 33.5 g/dL (ref 30.0–36.0)
MCV: 80.9 fl (ref 78.0–100.0)
MONOS PCT: 6.9 % (ref 3.0–12.0)
Monocytes Absolute: 0.4 10*3/uL (ref 0.1–1.0)
NEUTROS ABS: 4.1 10*3/uL (ref 1.4–7.7)
Neutrophils Relative %: 63.8 % (ref 43.0–77.0)
PLATELETS: 359 10*3/uL (ref 150.0–400.0)
RBC: 4.88 Mil/uL (ref 3.87–5.11)
RDW: 14.2 % (ref 11.5–15.5)
WBC: 6.4 10*3/uL (ref 4.0–10.5)

## 2015-07-24 LAB — COMPREHENSIVE METABOLIC PANEL
ALT: 14 U/L (ref 0–35)
AST: 14 U/L (ref 0–37)
Albumin: 3.8 g/dL (ref 3.5–5.2)
Alkaline Phosphatase: 70 U/L (ref 39–117)
BILIRUBIN TOTAL: 0.4 mg/dL (ref 0.2–1.2)
BUN: 15 mg/dL (ref 6–23)
CALCIUM: 9 mg/dL (ref 8.4–10.5)
CHLORIDE: 105 meq/L (ref 96–112)
CO2: 28 meq/L (ref 19–32)
Creatinine, Ser: 0.82 mg/dL (ref 0.40–1.20)
GFR: 79.86 mL/min (ref >60.00)
Glucose, Bld: 96 mg/dL (ref 70–99)
POTASSIUM: 4.6 meq/L (ref 3.5–5.1)
Sodium: 137 mEq/L (ref 135–145)
Total Protein: 6.8 g/dL (ref 6.0–8.3)

## 2015-07-24 LAB — LIPID PANEL
CHOLESTEROL: 201 mg/dL — AB (ref 0–200)
HDL: 33.4 mg/dL — ABNORMAL LOW (ref >39.00)
LDL CALC: 138 mg/dL — AB (ref 0–99)
NonHDL: 167.75
Total CHOL/HDL Ratio: 6
Triglycerides: 149 mg/dL (ref 0.0–149.0)
VLDL: 29.8 mg/dL (ref 0.0–40.0)

## 2015-07-24 LAB — HEMOGLOBIN A1C: Hgb A1c MFr Bld: 5.9 % (ref 4.6–6.5)

## 2015-07-31 DIAGNOSIS — D2261 Melanocytic nevi of right upper limb, including shoulder: Secondary | ICD-10-CM | POA: Diagnosis not present

## 2015-07-31 DIAGNOSIS — D225 Melanocytic nevi of trunk: Secondary | ICD-10-CM | POA: Diagnosis not present

## 2015-07-31 DIAGNOSIS — D2272 Melanocytic nevi of left lower limb, including hip: Secondary | ICD-10-CM | POA: Diagnosis not present

## 2015-07-31 DIAGNOSIS — D2271 Melanocytic nevi of right lower limb, including hip: Secondary | ICD-10-CM | POA: Diagnosis not present

## 2015-07-31 DIAGNOSIS — D485 Neoplasm of uncertain behavior of skin: Secondary | ICD-10-CM | POA: Diagnosis not present

## 2015-09-04 DIAGNOSIS — L905 Scar conditions and fibrosis of skin: Secondary | ICD-10-CM | POA: Diagnosis not present

## 2015-09-04 DIAGNOSIS — D485 Neoplasm of uncertain behavior of skin: Secondary | ICD-10-CM | POA: Diagnosis not present

## 2015-09-04 DIAGNOSIS — D2272 Melanocytic nevi of left lower limb, including hip: Secondary | ICD-10-CM | POA: Diagnosis not present

## 2015-10-06 DIAGNOSIS — H5213 Myopia, bilateral: Secondary | ICD-10-CM | POA: Diagnosis not present

## 2015-10-06 DIAGNOSIS — H52223 Regular astigmatism, bilateral: Secondary | ICD-10-CM | POA: Diagnosis not present

## 2015-10-06 DIAGNOSIS — H524 Presbyopia: Secondary | ICD-10-CM | POA: Diagnosis not present

## 2015-10-07 ENCOUNTER — Ambulatory Visit (INDEPENDENT_AMBULATORY_CARE_PROVIDER_SITE_OTHER): Payer: 59

## 2015-10-07 DIAGNOSIS — Z23 Encounter for immunization: Secondary | ICD-10-CM

## 2015-10-07 NOTE — Progress Notes (Signed)
Patient came in for TD injection.  Received in right deltoid.  Patient tolerated well. thanks

## 2015-11-19 DIAGNOSIS — Z01419 Encounter for gynecological examination (general) (routine) without abnormal findings: Secondary | ICD-10-CM | POA: Diagnosis not present

## 2015-11-19 DIAGNOSIS — Z1239 Encounter for other screening for malignant neoplasm of breast: Secondary | ICD-10-CM | POA: Diagnosis not present

## 2015-11-19 DIAGNOSIS — Z30431 Encounter for routine checking of intrauterine contraceptive device: Secondary | ICD-10-CM | POA: Diagnosis not present

## 2016-06-28 ENCOUNTER — Other Ambulatory Visit: Payer: Self-pay | Admitting: Family Medicine

## 2016-06-29 ENCOUNTER — Encounter: Payer: Self-pay | Admitting: Nurse Practitioner

## 2016-07-19 ENCOUNTER — Encounter: Payer: Self-pay | Admitting: Family Medicine

## 2016-07-19 ENCOUNTER — Ambulatory Visit (INDEPENDENT_AMBULATORY_CARE_PROVIDER_SITE_OTHER): Payer: 59 | Admitting: Family Medicine

## 2016-07-19 VITALS — BP 114/76 | HR 73 | Temp 97.8°F | Ht 68.0 in | Wt 269.2 lb

## 2016-07-19 DIAGNOSIS — Z0000 Encounter for general adult medical examination without abnormal findings: Secondary | ICD-10-CM

## 2016-07-19 DIAGNOSIS — Z1231 Encounter for screening mammogram for malignant neoplasm of breast: Secondary | ICD-10-CM | POA: Diagnosis not present

## 2016-07-19 DIAGNOSIS — Z1239 Encounter for other screening for malignant neoplasm of breast: Secondary | ICD-10-CM

## 2016-07-19 MED ORDER — FLUTICASONE PROPIONATE 50 MCG/ACT NA SUSP: 1.0000 | Freq: Every day | NASAL | 11 refills | Status: DC

## 2016-07-19 NOTE — Assessment & Plan Note (Signed)
Physical exam completed. Patient is obese. Encouraged healthy diet and exercise. Mammogram ordered. Pap smear and breast exam deferred given these are completed through gynecology. Labs as outlined below.

## 2016-07-19 NOTE — Progress Notes (Signed)
Pre visit review using our clinic review tool, if applicable. No additional management support is needed unless otherwise documented below in the visit note. 

## 2016-07-19 NOTE — Patient Instructions (Signed)
Nice to see you. Please work on diet and exercises we discussed. Please go get the lab work fasting. Please schedule your mammogram.

## 2016-07-19 NOTE — Progress Notes (Signed)
[name], MD Phone: [phone]  Susan Bentley is a 46 y.o. female who presents today for physical exam.  Does not exercise very much. Plans to start getting back to it. Tries to cook pretty healthfully. Does snack at night. Pap smear last year through gynecology. Breast exam last year through gynecology. Has been less than a year since both of these were done. She does have a light menstrual cycle for a few days once monthly. Has an IUD in place. Sexual active with one partner. Mammogram is due this month. Has a family history of breast cancer in her mother. Her maternal grandmother died of breast or colon cancer though she is unsure. Up-to-date on tetanus vaccination and HIV testing. Rare alcohol use. No tobacco use. No illicit drug use.  Active Ambulatory Problems    Diagnosis Date Noted  . Routine general medical examination at a health care facility 03/29/2013  . GERD (gastroesophageal reflux disease) 03/29/2013  . History of recurrent UTI (urinary tract infection) 03/29/2013  . Headache 03/29/2013   Resolved Ambulatory Problems    Diagnosis Date Noted  . No Resolved Ambulatory Problems   Past Medical History:  Diagnosis Date  . Allergy   . Migraine   . UTI (lower urinary tract infection)     Family History  Problem Relation Age of Onset  . Cancer Maternal Grandmother 36    colon cancer, breast cancer  . Hyperlipidemia Paternal Grandmother   . Hypertension Paternal Grandmother   . Heart disease Paternal Grandmother   . Cancer Paternal Grandmother   . Hyperlipidemia Paternal Grandfather   . Stroke Paternal Grandfather   . Hypertension Paternal Grandfather   . Aneurysm Paternal Grandfather   . Hypothyroidism Father   . Hypothyroidism Sister   . Breast cancer Mother 78    Social History   Social History  . Marital status: Married    Spouse name: [name]  . Number of children: 2  . Years of education: 16   Occupational History  . RN - [employer]   Social History Main Topics  . Smoking status: Never Smoker  . Smokeless tobacco: Never Used  . Alcohol use No  . Drug use: No  . Sexual activity: Yes   Other Topics Concern  . Not on file   Social History Narrative   Zorianna grew up in [place]. She lives at home with her husband, [name], of 3 years. Her youngest son, [name], is a Paramedic in [employer] and lives at home with them. Stachia has another son, [name], who is away at college at [employer]. Okie attended [employer] and obtained her Bachelors in Nursing. 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 She also enjoys gardening in the summer. She also quilts.    ROS  General:  Negative for nexplained weight loss, fever Skin: Negative for new or changing mole, sore that won't heal HEENT: Negative for trouble hearing, trouble seeing, ringing in ears, mouth sores, hoarseness, change in voice, dysphagia. CV:  Negative for chest pain, dyspnea, edema, palpitations Resp: Negative for cough, dyspnea, hemoptysis GI: Negative for nausea, vomiting, diarrhea, constipation, abdominal pain, melena, hematochezia. GU: Negative for dysuria, incontinence, urinary hesitance, hematuria, vaginal or penile discharge, polyuria, sexual difficulty, lumps in testicle or breasts MSK: Negative for muscle cramps or aches, joint pain or swelling Neuro: Negative for headaches, weakness, numbness, dizziness, passing out/fainting Psych: Negative for depression, anxiety, memory problems  Objective  Physical Exam Vitals:   07/19/16 1516  BP: 114/76  Pulse: 73  Temp: 97.8 F (36.6 C)    BP Readings from Last 3 Encounters:  07/19/16 114/76  06/29/15 114/82  04/04/14 120/72   Wt Readings from Last 3 Encounters:  07/19/16 269 lb 3.2 oz (122.1 kg)  06/29/15 251 lb (113.9 kg)  04/04/14 253 lb 12.8 oz (115.1 kg)    Physical Exam  Constitutional: No distress.  HENT:  Head: Normocephalic and  atraumatic.  Mouth/Throat: Oropharynx is clear and moist. No oropharyngeal exudate.  Eyes: Conjunctivae are normal. Pupils are equal, round, and reactive to light.  Cardiovascular: Normal rate, regular rhythm and normal heart sounds.   Pulmonary/Chest: Effort normal and breath sounds normal.  Abdominal: Soft. Bowel sounds are normal. She exhibits no distension. There is no tenderness.  Musculoskeletal: She exhibits no edema.  Neurological: She is alert. Gait normal.  Skin: Skin is warm and dry. She is not diaphoretic.  Psychiatric: Mood and affect normal.     Assessment/Plan:   Routine general medical examination at a health care facility Physical exam completed. Patient is obese. Encouraged healthy diet and exercise. Mammogram ordered. Pap smear and breast exam deferred given these are completed through gynecology. Labs as outlined below.   Orders Placed This Encounter  Procedures  . MM SCREENING BREAST TOMO BILATERAL    Patient wanted to call on her own to schedule    Standing Status:   Future    Standing Expiration Date:   09/18/2017    Order Specific Question:   Reason for Exam (SYMPTOM  OR DIAGNOSIS REQUIRED)    Answer:   breast cancer screening    Order Specific Question:   Is the patient pregnant?    Answer:   No    Order Specific Question:   Preferred imaging location?    Answer:   [facility] ordered this encounter  Medications  . fluticasone (FLONASE) 50 MCG/ACT nasal spray    Sig: Place 1 spray into both nostrils daily.    Dispense:  16 g    Refill:  [place], [place]

## 2016-07-22 LAB — LIPID PANEL
Cholesterol: 219 mg/dL — AB (ref 0–200)
HDL: 36 mg/dL (ref 35–70)
LDL Cholesterol: 138 mg/dL
TRIGLYCERIDES: 227 mg/dL — AB (ref 40–160)

## 2016-07-22 LAB — TSH: TSH: 2.09 u[IU]/mL (ref 0.41–5.90)

## 2016-07-22 LAB — BASIC METABOLIC PANEL
BUN: 14 mg/dL (ref 4–21)
CREATININE: 0.9 mg/dL (ref 0.5–1.1)
GLUCOSE: 102 mg/dL
Potassium: 4.9 mmol/L (ref 3.4–5.3)
SODIUM: 148 mmol/L — AB (ref 137–147)

## 2016-07-22 LAB — CBC AND DIFFERENTIAL
HEMATOCRIT: 39 % (ref 36–46)
Hemoglobin: 12.6 g/dL (ref 12.0–16.0)
NEUTROS ABS: 4 /uL
Platelets: 398 10*3/uL (ref 150–399)
WBC: 6.7 10^3/mL

## 2016-07-22 LAB — VITAMIN D 25 HYDROXY (VIT D DEFICIENCY, FRACTURES): Vit D, 25-Hydroxy: 31.6

## 2016-07-22 LAB — HEMOGLOBIN A1C: Hemoglobin A1C: 5.6

## 2016-07-22 LAB — HEPATIC FUNCTION PANEL
ALK PHOS: 101 U/L (ref 25–125)
ALT: 16 U/L (ref 7–35)
AST: 18 U/L (ref 13–35)
BILIRUBIN, TOTAL: 0.3 mg/dL

## 2016-07-25 ENCOUNTER — Encounter: Payer: Self-pay | Admitting: Family Medicine

## 2016-07-25 ENCOUNTER — Telehealth: Payer: Self-pay

## 2016-07-25 NOTE — Telephone Encounter (Signed)
Labs were received

## 2016-07-25 NOTE — Telephone Encounter (Signed)
thanks

## 2016-07-25 NOTE — Telephone Encounter (Signed)
Patient sent an email to the main [employer] and stated that she faxed lab results to [place], were these received? If not please let me know, thanks

## 2016-07-29 ENCOUNTER — Other Ambulatory Visit: Payer: Self-pay | Admitting: Family Medicine

## 2016-07-29 DIAGNOSIS — D473 Essential (hemorrhagic) thrombocythemia: Secondary | ICD-10-CM

## 2016-07-29 DIAGNOSIS — E870 Hyperosmolality and hypernatremia: Secondary | ICD-10-CM

## 2016-07-29 DIAGNOSIS — D75839 Thrombocytosis, unspecified: Secondary | ICD-10-CM

## 2016-08-01 ENCOUNTER — Telehealth: Payer: Self-pay

## 2016-08-01 NOTE — Telephone Encounter (Signed)
Patient notified and will have these done at her work

## 2016-08-01 NOTE — Telephone Encounter (Signed)
-----   Message from Leone Haven, MD sent at 07/29/2016  5:25 PM EDT ----- Regarding: labs Please let the patient know that her platelets are minimally elevated. Her sodium was minimally elevated as well. I would like to recheck these things. Her cholesterol is slightly elevated and she needs work on diet and exercise for this. Her other lab work is acceptable. Orders have been placed for repeat labs. Thanks. Eric.

## 2016-08-11 ENCOUNTER — Ambulatory Visit
Admission: RE | Admit: 2016-08-11 | Discharge: 2016-08-11 | Disposition: A | Discharge status: Home / Self Care | Payer: 59 | Source: Ambulatory Visit | Attending: Family Medicine | Admitting: Family Medicine

## 2016-08-11 DIAGNOSIS — Z1239 Encounter for other screening for malignant neoplasm of breast: Secondary | ICD-10-CM

## 2016-08-11 DIAGNOSIS — Z1231 Encounter for screening mammogram for malignant neoplasm of breast: Secondary | ICD-10-CM | POA: Diagnosis not present

## 2016-08-11 LAB — HM MAMMOGRAPHY

## 2016-08-12 DIAGNOSIS — Z872 Personal history of diseases of the skin and subcutaneous tissue: Secondary | ICD-10-CM | POA: Diagnosis not present

## 2016-08-12 DIAGNOSIS — Z09 Encounter for follow-up examination after completed treatment for conditions other than malignant neoplasm: Secondary | ICD-10-CM | POA: Diagnosis not present

## 2016-08-12 DIAGNOSIS — D225 Melanocytic nevi of trunk: Secondary | ICD-10-CM | POA: Diagnosis not present

## 2016-08-12 DIAGNOSIS — D2262 Melanocytic nevi of left upper limb, including shoulder: Secondary | ICD-10-CM | POA: Diagnosis not present

## 2016-08-12 DIAGNOSIS — D485 Neoplasm of uncertain behavior of skin: Secondary | ICD-10-CM | POA: Diagnosis not present

## 2016-08-12 LAB — BASIC METABOLIC PANEL
BUN: 14 mg/dL (ref 4–21)
Creatinine: 0.8 mg/dL (ref 0.5–1.1)
GLUCOSE: 91 mg/dL
POTASSIUM: 4.7 mmol/L (ref 3.4–5.3)
SODIUM: 140 mmol/L (ref 137–147)

## 2016-08-12 LAB — CBC AND DIFFERENTIAL
HCT: 41 % (ref 36–46)
Hemoglobin: 12.3 g/dL (ref 12.0–16.0)
Neutrophils Absolute: 4 /uL
Platelets: 330 10*3/uL (ref 150–399)
WBC: 7 10^3/mL

## 2016-08-16 ENCOUNTER — Encounter: Payer: Self-pay | Admitting: Family Medicine

## 2016-08-30 ENCOUNTER — Ambulatory Visit (INDEPENDENT_AMBULATORY_CARE_PROVIDER_SITE_OTHER): Payer: 59 | Admitting: Obstetrics and Gynecology

## 2016-08-30 ENCOUNTER — Encounter: Payer: Self-pay | Admitting: Obstetrics and Gynecology

## 2016-08-30 VITALS — BP 130/90 | HR 78 | Ht 68.0 in | Wt 269.0 lb

## 2016-08-30 DIAGNOSIS — Z803 Family history of malignant neoplasm of breast: Secondary | ICD-10-CM

## 2016-08-30 DIAGNOSIS — Z124 Encounter for screening for malignant neoplasm of cervix: Secondary | ICD-10-CM | POA: Diagnosis not present

## 2016-08-30 DIAGNOSIS — Z01419 Encounter for gynecological examination (general) (routine) without abnormal findings: Secondary | ICD-10-CM | POA: Diagnosis not present

## 2016-08-30 DIAGNOSIS — Z1151 Encounter for screening for human papillomavirus (HPV): Secondary | ICD-10-CM | POA: Diagnosis not present

## 2016-08-30 DIAGNOSIS — Z30431 Encounter for routine checking of intrauterine contraceptive device: Secondary | ICD-10-CM | POA: Diagnosis not present

## 2016-08-30 NOTE — Progress Notes (Signed)
Chief Complaint  Patient presents with  . Gynecologic Exam    iud near it's end     HPI:      [name] is a 46 y.o. [id number] who LMP was No LMP recorded. Patient is premenopausal., presents today for her annual examination.  Her menses are regular every 28-30 days, lasting 5 days, very light. Used to be absent or Q2 mos with IUD.  Dysmenorrhea mild, occurring first 1-2 days of flow. She does have occas intermenstrual bleeding.  Sex activity: single partner, contraception - IUD. Mirena placed 03/13/12. Pt will want another one. Last Pap: September 04, 2013  Results were: no abnormalities /neg HPV DNA  Hx of STDs: HPV  Last mammogram: Aug 11, 2016  Results were: normal--routine follow-up in 12 months There is a FH of breast cancer in her mom and possibly MGM (cancer was widespread--no primary site determined), genetic testing not done. There is no FH of ovarian cancer. The patient does do self-breast exams.  Tobacco use: The patient denies current or previous tobacco use. Alcohol use: none Exercise: not active  She does get adequate calcium and Vitamin D in her diet.    Past Medical History:  Diagnosis Date  . Allergy    Claritin and Flonas helps manage symptoms  . History of abnormal cervical Pap smear    2009;2013 LGSIL - 10/13/14 neg  . History of mammogram 10/13/2014   neg  . History of Papanicolaou smear of cervix 10/13/2014   neg  . Migraine   . UTI (lower urinary tract infection)    Seen by urology. Macrobid prn    Past Surgical History:  Procedure Laterality Date  . COLPOSCOPY  12/02/2011  . INTRAUTERINE DEVICE (IUD) INSERTION    . SEPTOPLASTY  2000    Family History  Problem Relation Age of Onset  . Cancer Maternal Grandmother 60       colon cancer, breast cancer, brain cancer  . Hyperlipidemia Paternal Grandmother   . Hypertension Paternal Grandmother   . Heart disease Paternal Grandmother 49  . Cancer Paternal Grandmother 64       leukemia  .  Hyperlipidemia Paternal Grandfather   . Stroke Paternal Grandfather   . Hypertension Paternal Grandfather   . Aneurysm Paternal Grandfather   . Heart disease Paternal Grandfather   . Hypothyroidism Father   . Hypothyroidism Sister   . Breast cancer Mother 27  . Heart disease Maternal Grandfather 65  . Cancer Other 50       colon    Social History   Social History  . Marital status: Married    Spouse name: [name]  . Number of children: 2  . Years of education: 16   Occupational History  . RN - [employer]   Social History Main Topics  . Smoking status: Never Smoker  . Smokeless tobacco: Never Used  . Alcohol use 0.0 oz/week     Comment: occ  . Drug use: No  . Sexual activity: Yes    Birth control/ protection: IUD, Other-see comments     Comment: premenopausal   Other Topics Concern  . Not on file   Social History Narrative   Taleshia grew up in [place]. She lives at home with her husband, [name], of 3 years. Her youngest son, [name], is a Paramedic in [employer] and lives at home with them. Jahaira has another son, [name], who is away at college at [employer]. [employer]  attended [employer] and obtained her Bachelors in Nursing. 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 She also enjoys gardening in the summer. She also quilts.     Current Outpatient Prescriptions:  .  fluticasone (FLONASE) 50 MCG/ACT nasal spray, Place 1 spray into both nostrils daily., Disp: 16 g, Rfl: 11 .  levonorgestrel (MIRENA) 20 MCG/24HR IUD, 1 each by Intrauterine route once., Disp: , Rfl:  .  Loratadine 10 MG CAPS, Take 1 tablet by mouth daily., Disp: , Rfl:  .  Meth-Hyo-M Bl-Na Phos-Ph Sal (URIBEL) 118 MG CAPS, Take 1 capsule (118 mg total) by mouth daily as needed., Disp: 90 capsule, Rfl: 3 .  nitrofurantoin (MACRODANTIN) 50 MG capsule, Take 1 capsule by mouth as needed., Disp: , Rfl:  .  omeprazole (PRILOSEC) 40 MG capsule, Take 1 capsule  (40 mg total) by mouth daily., Disp: 90 capsule, Rfl: 3  ROS:  Review of Systems  Constitutional: Negative for fatigue, fever and unexpected weight change.  Respiratory: Negative for cough, shortness of breath and wheezing.   Cardiovascular: Negative for chest pain, palpitations and leg swelling.  Gastrointestinal: Negative for blood in stool, constipation, diarrhea, nausea and vomiting.  Endocrine: Negative for cold intolerance, heat intolerance and polyuria.  Genitourinary: Negative for dyspareunia, dysuria, flank pain, frequency, genital sores, hematuria, menstrual problem, pelvic pain, urgency, vaginal bleeding, vaginal discharge and vaginal pain.  Musculoskeletal: Negative for back pain, joint swelling and myalgias.  Skin: Negative for rash.  Neurological: Negative for dizziness, syncope, light-headedness, numbness and headaches.  Hematological: Negative for adenopathy.  Psychiatric/Behavioral: Negative for agitation, confusion, sleep disturbance and suicidal ideas. The patient is not nervous/anxious.      Objective: BP 130/90 (Patient Position: Sitting)   Pulse 78   Ht 5\' 8"  (1.727 m)   Wt 269 lb (122 kg)   BMI 40.90 kg/m    Physical Exam  Constitutional: She is oriented to person, place, and time. She appears well-developed and well-nourished.  Genitourinary: Vagina normal and uterus normal. There is no rash or tenderness on the right labia. There is no rash or tenderness on the left labia. No erythema or tenderness in the vagina. No vaginal discharge found. Right adnexum does not display mass and does not display tenderness. Left adnexum does not display mass and does not display tenderness. Cervix does not exhibit motion tenderness or polyp. Uterus is not enlarged or tender.  Neck: Normal range of motion. No thyromegaly present.  Cardiovascular: Normal rate, regular rhythm and normal heart sounds.   No murmur heard. Pulmonary/Chest: Effort normal and breath sounds normal.  Right breast exhibits no mass, no nipple discharge, no skin change and no tenderness. Left breast exhibits no mass, no nipple discharge, no skin change and no tenderness.  Abdominal: Soft. There is no tenderness. There is no guarding.  Musculoskeletal: Normal range of motion.  Neurological: She is alert and oriented to person, place, and time. No cranial nerve deficit.  Psychiatric: She has a normal mood and affect. Her behavior is normal.  Vitals reviewed.   Assessment/Plan: Encounter for annual routine gynecological examination  Cervical cancer screening - Plan: IGP, Aptima HPV  Screening for HPV (human papillomavirus) - Plan: IGP, Aptima HPV  Family history of breast cancer - Cont yearly mammos. Doesn't qualify for cancer genetic testing.  Encounter for routine checking of intrauterine contraceptive device (IUD) - IUD in place. Due for rem 12/18. Pt to call with 11/18 menses for replacement. NSAIDs.  GYN counsel mammography screening, adequate intake of calcium and vitamin D     F/U  Return in about 1 year (around 08/30/2017).  Alicia B. Copland, PA-C 08/30/2016 4:33 PM

## 2016-09-02 LAB — IGP, APTIMA HPV
HPV Aptima: NEGATIVE
PAP Smear Comment: 0

## 2016-11-30 DIAGNOSIS — H524 Presbyopia: Secondary | ICD-10-CM | POA: Diagnosis not present

## 2016-11-30 DIAGNOSIS — H5213 Myopia, bilateral: Secondary | ICD-10-CM | POA: Diagnosis not present

## 2016-11-30 DIAGNOSIS — H52223 Regular astigmatism, bilateral: Secondary | ICD-10-CM | POA: Diagnosis not present

## 2017-01-03 ENCOUNTER — Telehealth: Payer: Self-pay | Admitting: Obstetrics and Gynecology

## 2017-01-03 NOTE — Telephone Encounter (Signed)
Pt is schedule for mirena removal and insertion with ABC on 02/20/17

## 2017-02-09 NOTE — Telephone Encounter (Signed)
Noted. Will order to arrive by apt date/time. 

## 2017-02-20 ENCOUNTER — Encounter: Payer: Self-pay | Admitting: Obstetrics and Gynecology

## 2017-02-20 ENCOUNTER — Ambulatory Visit (INDEPENDENT_AMBULATORY_CARE_PROVIDER_SITE_OTHER): Payer: 59 | Admitting: Obstetrics and Gynecology

## 2017-02-20 VITALS — BP 110/80 | HR 80 | Ht 68.0 in | Wt 262.0 lb

## 2017-02-20 DIAGNOSIS — Z30433 Encounter for removal and reinsertion of intrauterine contraceptive device: Secondary | ICD-10-CM

## 2017-02-20 MED ORDER — LEVONORGESTREL 20 MCG/24HR IU IUD: 1.0000 | INTRAUTERINE_SYSTEM | Freq: Once | INTRAUTERINE | 0 refills | Status: DC

## 2017-02-20 NOTE — Patient Instructions (Addendum)
I value your feedback and entrusting us with your care. If you get a [facility] patient survey, I would appreciate you taking the time to let us know about your experience today. Thank you!  [facility] [phone]  Instructions after IUD insertion  Most women experience no significant problems after insertion of an IUD, however minor cramping and spotting for a few days is common. Cramps may be treated with ibuprofen 800mg every 8 hours or Tylenol 650 mg every 4 hours. Contact [facility] immediately if you experience any of the following symptoms during the next week: temperature >99.6 degrees, worsening pelvic pain, abdominal pain, fainting, unusually heavy vaginal bleeding, foul vaginal discharge, or if you think you have expelled the IUD.  Nothing inserted in the vagina for 48 hours. You will be scheduled for a follow up visit in approximately four weeks.  You should check monthly to be sure you can feel the IUD strings in the upper vagina. If you are having a monthly period, try to check after each period. If you cannot feel the IUD strings,  contact [facility] immediately so we can do an exam to determine if the IUD has been expelled.   Please use backup protection until we can confirm the IUD is in place.  Call [facility] if you are exposed to or diagnosed with a sexually transmitted infection, as we will need to discuss whether it is safe for you to continue using an IUD.   

## 2017-02-20 NOTE — Progress Notes (Signed)
   Chief Complaint  Patient presents with  . Contraception    IUD removal and reinsertion     History of Present Illness:  Susan Bentley is a 46 y.o. that had a Mirena IUD placed approximately 5 years ago. Since that time, she denies dyspareunia, pelvic pain,  vaginal d/c, heavy bleeding. She has had irreg bleeding this yr.  She is current on her annual 6/18.   BP 110/80   Pulse 80   Ht 5\' 8"  (1.727 m)   Wt 262 lb (118.8 kg)   BMI 39.84 kg/m   Pelvic exam:  Two IUD strings present seen coming from the cervical os. EGBUS, vaginal vault and cervix: within normal limits  IUD Removal Strings of IUD identified and grasped.  IUD removed without problem with ring forceps.  Pt tolerated this well.  IUD noted to be intact.   IUD Insertion Procedure Note Patient identified, informed consent performed, consent signed.   Discussed risks of irregular bleeding, cramping, infection, malpositioning or misplacement of the IUD outside the uterus which may require further procedure such as laparoscopy, risk of failure <1%. Time out was performed.    Speculum placed in the vagina.  Cervix visualized.  Cleaned with Betadine x 2.  Grasped anteriorly with a single tooth tenaculum.  Uterus sounded to 8.0 cm.   IUD placed per manufacturer's recommendations.  Strings trimmed to 3 cm. Tenaculum was removed, good hemostasis noted.  Patient tolerated procedure well.   ASSESSMENT:  Encounter for removal and reinsertion of intrauterine contraceptive device (IUD) - Plan: levonorgestrel (MIRENA) 20 MCG/24HR IUD   Meds ordered this encounter  Medications  . levonorgestrel (MIRENA) 20 MCG/24HR IUD    Sig: 1 Intra Uterine Device (1 each total) by Intrauterine route once for 1 dose.    Dispense:  1 each    Refill:  0     Plan:  Patient was given post-procedure instructions.  She was advised to have backup contraception for one week.   Call if you are having increasing pain, cramps or bleeding or if you  have a fever greater than 100.4 degrees F., shaking chills, nausea or vomiting. Patient was also asked to check IUD strings periodically and follow up in 4 weeks for IUD check.  Return in about 4 weeks (around 03/20/2017) for IUD check.  Susan Clauss B. Riannon Mukherjee, PA-C 02/20/2017 9:46 AM

## 2017-03-23 ENCOUNTER — Ambulatory Visit (INDEPENDENT_AMBULATORY_CARE_PROVIDER_SITE_OTHER): Payer: 59 | Admitting: Obstetrics and Gynecology

## 2017-03-23 ENCOUNTER — Encounter: Payer: Self-pay | Admitting: Obstetrics and Gynecology

## 2017-03-23 VITALS — BP 100/70 | HR 86 | Ht 68.0 in | Wt 263.0 lb

## 2017-03-23 DIAGNOSIS — Z30431 Encounter for routine checking of intrauterine contraceptive device: Secondary | ICD-10-CM

## 2017-03-23 NOTE — Progress Notes (Signed)
   Chief Complaint  Patient presents with  . Contraception     History of Present Illness:  Susan Bentley is a 46 y.o. that had a Mirena IUD placed approximately 1 month ago. Since that time, she denies dyspareunia, pelvic pain, vaginal d/c, heavy bleeding. She had 2 wks of bleeding after insertion but that resolved.   Review of Systems  Constitutional: Negative for fever.  Gastrointestinal: Negative for blood in stool, constipation, diarrhea, nausea and vomiting.  Genitourinary: Negative for dyspareunia, dysuria, flank pain, frequency, hematuria, urgency, vaginal bleeding, vaginal discharge and vaginal pain.  Musculoskeletal: Negative for back pain.  Skin: Negative for rash.    Physical Exam:  BP 100/70   Pulse 86   Ht 5\' 8"  (1.727 m)   Wt 263 lb (119.3 kg)   BMI 39.99 kg/m  Body mass index is 39.99 kg/m.  Pelvic exam:  Two IUD strings present seen coming from the cervical os. EGBUS, vaginal vault and cervix: within normal limits   Assessment:  Routine checking of IUD Encounter for routine checking of intrauterine contraceptive device (IUD)  IUD strings present in proper location; pt doing well  Plan: F/u if any signs of infection or can no longer feel the strings.   Alicia B. Copland, PA-C 03/23/2017 9:45 AM

## 2017-03-23 NOTE — Patient Instructions (Signed)
I value your feedback and entrusting us with your care. If you get a [facility] patient survey, I would appreciate you taking the time to let us know about your experience today. Thank you! 

## 2017-05-08 DIAGNOSIS — M25512 Pain in left shoulder: Secondary | ICD-10-CM | POA: Diagnosis not present

## 2017-05-08 DIAGNOSIS — M7542 Impingement syndrome of left shoulder: Secondary | ICD-10-CM | POA: Diagnosis not present

## 2017-05-08 DIAGNOSIS — M7582 Other shoulder lesions, left shoulder: Secondary | ICD-10-CM | POA: Diagnosis not present

## 2017-05-09 DIAGNOSIS — M7542 Impingement syndrome of left shoulder: Secondary | ICD-10-CM | POA: Diagnosis not present

## 2017-05-09 DIAGNOSIS — M25512 Pain in left shoulder: Secondary | ICD-10-CM | POA: Diagnosis not present

## 2017-05-09 DIAGNOSIS — M7582 Other shoulder lesions, left shoulder: Secondary | ICD-10-CM | POA: Diagnosis not present

## 2017-05-22 NOTE — Telephone Encounter (Signed)
Mirena received 02/20/17

## 2017-07-19 LAB — CBC AND DIFFERENTIAL
HCT: 39 (ref 36–46)
HEMOGLOBIN: 12.6 (ref 12.0–16.0)
NEUTROS ABS: 4
PLATELETS: 363 (ref 150–399)
WBC: 5.9

## 2017-07-19 LAB — HEMOGLOBIN A1C: Hemoglobin A1C: 5.5

## 2017-07-19 LAB — IRON,TIBC AND FERRITIN PANEL: Iron: 64

## 2017-07-19 LAB — HEPATIC FUNCTION PANEL
ALK PHOS: 87 (ref 25–125)
ALT: 23 (ref 7–35)
AST: 20 (ref 13–35)
BILIRUBIN, TOTAL: 0.3

## 2017-07-19 LAB — BASIC METABOLIC PANEL
BUN: 14 (ref 4–21)
Creatinine: 0.8 (ref 0.5–1.1)
Glucose: 94
Potassium: 4.7 (ref 3.4–5.3)
Sodium: 140 (ref 137–147)

## 2017-07-19 LAB — LIPID PANEL
Cholesterol: 222 — AB (ref 0–200)
HDL: 38 (ref 35–70)
LDL CALC: 156
TRIGLYCERIDES: 140 (ref 40–160)

## 2017-07-19 LAB — VITAMIN D 25 HYDROXY (VIT D DEFICIENCY, FRACTURES): Vit D, 25-Hydroxy: 37.3

## 2017-07-19 LAB — TSH: TSH: 2.15 (ref 0.41–5.90)

## 2017-07-20 ENCOUNTER — Encounter: Payer: Self-pay | Admitting: Family Medicine

## 2017-07-21 ENCOUNTER — Ambulatory Visit (INDEPENDENT_AMBULATORY_CARE_PROVIDER_SITE_OTHER): Payer: 59 | Admitting: Family Medicine

## 2017-07-21 ENCOUNTER — Encounter: Payer: Self-pay | Admitting: Family Medicine

## 2017-07-21 ENCOUNTER — Other Ambulatory Visit: Payer: Self-pay

## 2017-07-21 ENCOUNTER — Other Ambulatory Visit: Payer: Self-pay | Admitting: Family Medicine

## 2017-07-21 VITALS — BP 100/76 | HR 83 | Temp 98.2°F | Ht 67.5 in | Wt 265.6 lb

## 2017-07-21 DIAGNOSIS — Z1239 Encounter for other screening for malignant neoplasm of breast: Secondary | ICD-10-CM

## 2017-07-21 DIAGNOSIS — Z0000 Encounter for general adult medical examination without abnormal findings: Secondary | ICD-10-CM

## 2017-07-21 DIAGNOSIS — Z1231 Encounter for screening mammogram for malignant neoplasm of breast: Secondary | ICD-10-CM | POA: Diagnosis not present

## 2017-07-21 NOTE — Progress Notes (Signed)
Susan Rumps, MD Phone: [phone]  Susan Bentley is a 47 y.o. female who presents today for cpe.  Not exercising very much.  Wants to start walking 2 days a week. Tries to eat fairly healthfully.  Occasional fried fatty foods.  No soda or sweet tea.  Mostly water. Pap smear up-to-date.  She sees gynecology.  She had her IUD replaced and noted she had no bleeding though then had some bleeding for 2 weeks.  This has tapered off.  She has not letter gynecologist now. Mammogram is coming up on being due.  This has been scheduled.  Breast exam through gynecology. She reports a family history of breast cancer in her mother at age 85.  Her maternal grandmother had some type of cancer though they were unsure if it was breast or colon.  This occurred at age 60.  She has a maternal uncle with melanoma and a couple of other maternal uncles with basal cell cancers. She reports she had a colonoscopy previously given the vague potential history of colon cancer in her family at an early age.  She states it was normal and she was advised 10-year recall. No ovarian cancer family history. Tetanus vaccination up-to-date. No tobacco use, alcohol use, or illicit drug use. She sees ophthalmology once a year.  She sees her dentist twice a year.  Active Ambulatory Problems    Diagnosis Date Noted  . Routine general medical examination at a health care facility 03/29/2013  . GERD (gastroesophageal reflux disease) 03/29/2013  . History of recurrent UTI (urinary tract infection) 03/29/2013  . Headache 03/29/2013   Resolved Ambulatory Problems    Diagnosis Date Noted  . No Resolved Ambulatory Problems   Past Medical History:  Diagnosis Date  . Allergy   . History of abnormal cervical Pap smear   . History of mammogram 10/13/2014  . History of Papanicolaou smear of cervix 10/13/2014  . Migraine   . UTI (lower urinary tract infection)     Family History  Problem Relation Age of Onset  . Cancer  Maternal Grandmother 44       colon cancer, breast cancer, brain cancer  . Hyperlipidemia Paternal Grandmother   . Hypertension Paternal Grandmother   . Heart disease Paternal Grandmother 77  . Cancer Paternal Grandmother 66       leukemia  . Hyperlipidemia Paternal Grandfather   . Stroke Paternal Grandfather   . Hypertension Paternal Grandfather   . Aneurysm Paternal Grandfather   . Heart disease Paternal Grandfather   . Hypothyroidism Father   . Hypothyroidism Sister   . Breast cancer Mother 40  . Heart disease Maternal Grandfather 33  . Cancer Other 41       colon    Social History   Socioeconomic History  . Marital status: Married    Spouse name: Susan Bentley  . Number of children: 2  . Years of education: 40  . Highest education level: Not on file  Occupational History  . Occupation: Therapist, sports - Designer, television/film set    Comment: [employer]  . Financial resource strain: Not on file  . Food insecurity:    Worry: Not on file    Inability: Not on file  . Transportation needs:    Medical: Not on file    Non-medical: Not on file  Tobacco Use  . Smoking status: Never Smoker  . Smokeless tobacco: Never Used  Substance and Sexual Activity  . Alcohol use: Yes    Alcohol/week: 0.0 oz  Comment: occ  . Drug use: No  . Sexual activity: Yes    Birth control/protection: IUD, Other-see comments    Comment: premenopausal  Lifestyle  . Physical activity:    Days per week: Not on file    Minutes per session: Not on file  . Stress: Not on file  Relationships  . Social connections:    Talks on phone: Not on file    Gets together: Not on file    Attends religious service: Not on file    Active member of club or organization: Not on file    Attends meetings of clubs or organizations: Not on file    Relationship status: Not on file  . Intimate partner violence:    Fear of current or ex partner: Not on file    Emotionally abused: Not on file    Physically abused: Not on file      Forced sexual activity: Not on file  Other Topics Concern  . Not on file  Social History Narrative   Susan Bentley grew up in [place]. She lives at home with her husband, Susan Bentley, of 3 years. Her youngest son, Susan Bentley, is a Paramedic in [employer] and lives at home with them. Susan Bentley has another son, Susan Bentley, who is away at college at [employer]. Susan Bentley attended [employer] and obtained her Bachelors in Nursing. 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 She also enjoys gardening in the summer. She also quilts.    ROS  General:  Negative for nexplained weight loss, fever Skin: Negative for new or changing mole, sore that won't heal HEENT: Negative for trouble hearing, trouble seeing, ringing in ears, mouth sores, hoarseness, change in voice, dysphagia. CV:  Negative for chest pain, dyspnea, edema, palpitations Resp: Negative for cough, dyspnea, hemoptysis GI: Negative for nausea, vomiting, diarrhea, constipation, abdominal pain, melena, hematochezia. GU: Negative for dysuria, incontinence, urinary hesitance, hematuria, vaginal or penile discharge, polyuria, sexual difficulty, lumps in testicle or breasts MSK: Negative for muscle cramps or aches, joint pain or swelling Neuro: Negative for headaches, weakness, numbness, dizziness, passing out/fainting Psych: Negative for depression, anxiety, memory problems  Objective  Physical Exam Vitals:   07/21/17 1340  BP: 100/76  Pulse: 83  Temp: 98.2 F (36.8 C)  SpO2: 97%    BP Readings from Last 3 Encounters:  07/21/17 100/76  03/23/17 100/70  02/20/17 110/80   Wt Readings from Last 3 Encounters:  07/21/17 265 lb 9.6 oz (120.5 kg)  03/23/17 263 lb (119.3 kg)  02/20/17 262 lb (118.8 kg)    Physical Exam  Constitutional: No distress.  HENT:  Head: Normocephalic and atraumatic.  Mouth/Throat: Oropharynx is clear and moist.  Eyes: Pupils are equal, round, and reactive to light. Conjunctivae are  normal.  Neck: Neck supple.  Cardiovascular: Normal rate, regular rhythm and normal heart sounds.  Pulmonary/Chest: Effort normal and breath sounds normal.  Abdominal: Soft. Bowel sounds are normal. She exhibits no distension. There is no tenderness.  Musculoskeletal: She exhibits no edema.  Lymphadenopathy:    She has no cervical adenopathy.  Neurological: She is alert.  Skin: Skin is warm and dry. She is not diaphoretic.  Psychiatric: She has a normal mood and affect.     Assessment/Plan:   Routine general medical examination at a health care facility Physical exam completed.  Encouraged starting to exercise.  She will continue healthy diet.  Pelvic exam and breast exam deferred to gynecology.  Advised that she should  contact her gynecologist to let them know she had a menstrual cycle that lasted a couple of weeks and has now tapered off.  She stated she would do this.  Offered referral for genetic counseling given family history of cancer though she deferred this.  She will let us know if she would like to do this.  Her lab work has been reviewed and will be scanned into the chart.  ASCVD risk score does not meet criteria for treatment with statin therapy.  We will request her colonoscopy records from 2013.   No orders of the defined types were placed in this encounter.   No orders of the defined types were placed in this encounter.  The 10-year ASCVD risk score Mikey Bussing DC Brooke Bonito., et al., 2013) is: 1.2%*   Values used to calculate the score:     Age: 11 years     Sex: Female     Is Non-Hispanic African American: No     Diabetic: No     Tobacco smoker: No     Systolic Blood Pressure: 707 mmHg     Is BP treated: No     HDL Cholesterol: 38 mg/dL*     Total Cholesterol: 222 mg/dL*     * - Cholesterol units were assumed for this score calculation   Susan Rumps, MD [place]

## 2017-07-21 NOTE — Patient Instructions (Signed)
Nice to see you. Please start exercising 2 days a week.  Please continue to monitor your diet. If you would like to consider genetic counseling please let us know and we can place a referral. Please contact your gynecologist to let them know about your bleeding.

## 2017-07-21 NOTE — Assessment & Plan Note (Addendum)
Physical exam completed.  Encouraged starting to exercise.  She will continue healthy diet.  Pelvic exam and breast exam deferred to gynecology.  Advised that she should contact her gynecologist to let them know she had a menstrual cycle that lasted a couple of weeks and has now tapered off.  She stated she would do this.  Offered referral for genetic counseling given family history of cancer though she deferred this.  She will let us know if she would like to do this.  Her lab work has been reviewed and will be scanned into the chart.  ASCVD risk score does not meet criteria for treatment with statin therapy.  We will request her colonoscopy records from 2013.

## 2017-08-18 ENCOUNTER — Ambulatory Visit
Admission: RE | Admit: 2017-08-18 | Discharge: 2017-08-18 | Disposition: A | Discharge status: Home / Self Care | Payer: 59 | Source: Ambulatory Visit | Attending: Family Medicine | Admitting: Family Medicine

## 2017-08-18 DIAGNOSIS — Z1231 Encounter for screening mammogram for malignant neoplasm of breast: Secondary | ICD-10-CM

## 2017-08-23 DIAGNOSIS — D2271 Melanocytic nevi of right lower limb, including hip: Secondary | ICD-10-CM | POA: Diagnosis not present

## 2017-08-23 DIAGNOSIS — D2261 Melanocytic nevi of right upper limb, including shoulder: Secondary | ICD-10-CM | POA: Diagnosis not present

## 2017-08-23 DIAGNOSIS — D225 Melanocytic nevi of trunk: Secondary | ICD-10-CM | POA: Diagnosis not present

## 2017-08-23 DIAGNOSIS — D2272 Melanocytic nevi of left lower limb, including hip: Secondary | ICD-10-CM | POA: Diagnosis not present

## 2017-08-23 DIAGNOSIS — D2262 Melanocytic nevi of left upper limb, including shoulder: Secondary | ICD-10-CM | POA: Diagnosis not present

## 2017-09-22 ENCOUNTER — Encounter: Payer: Self-pay | Admitting: Family Medicine

## 2017-09-22 ENCOUNTER — Other Ambulatory Visit: Payer: Self-pay

## 2017-09-22 MED ORDER — NITROFURANTOIN MACROCRYSTAL 50 MG PO CAPS: 50.0000 mg | ORAL_CAPSULE | ORAL | 1 refills | Status: DC | PRN

## 2017-09-22 NOTE — Telephone Encounter (Signed)
Last OV 07/21/17 filed under historical

## 2017-09-26 ENCOUNTER — Ambulatory Visit: Payer: 59

## 2017-09-26 ENCOUNTER — Encounter: Payer: Self-pay | Admitting: Podiatry

## 2017-09-26 ENCOUNTER — Ambulatory Visit (INDEPENDENT_AMBULATORY_CARE_PROVIDER_SITE_OTHER): Payer: 59

## 2017-09-26 ENCOUNTER — Ambulatory Visit (INDEPENDENT_AMBULATORY_CARE_PROVIDER_SITE_OTHER): Payer: 59 | Admitting: Podiatry

## 2017-09-26 DIAGNOSIS — M7662 Achilles tendinitis, left leg: Secondary | ICD-10-CM | POA: Diagnosis not present

## 2017-09-26 DIAGNOSIS — M7661 Achilles tendinitis, right leg: Secondary | ICD-10-CM

## 2017-09-26 DIAGNOSIS — M722 Plantar fascial fibromatosis: Secondary | ICD-10-CM

## 2017-09-26 MED ORDER — MELOXICAM 15 MG PO TABS: 15.0000 mg | ORAL_TABLET | Freq: Every day | ORAL | 1 refills | Status: AC

## 2017-09-26 MED ORDER — METHYLPREDNISOLONE 4 MG PO TBPK: ORAL_TABLET | ORAL | 0 refills | Status: DC

## 2017-10-02 NOTE — Progress Notes (Signed)
   HPI: 47 year old female presenting today as a new patient with a chief complaint of intermittent sharp, shooting pain to the right heel and achilles area that began 2-3 months ago. She reports an associated nodule to the achilles area that is painful to the touch. She notes the pain is worse in the morning when she first wakes up. Standing and walking increases the pain throughout the day. She has been taking Advil, Etodolac, applying ice and heat therapy with minimal relief. Patient is here for further evaluation and treatment.   Past Medical History:  Diagnosis Date  . Allergy    Claritin and Flonas helps manage symptoms  . History of abnormal cervical Pap smear    2009;2013 LGSIL - 10/13/14 neg  . History of mammogram 10/13/2014   neg  . History of Papanicolaou smear of cervix 10/13/2014   neg  . Migraine   . UTI (lower urinary tract infection)    Seen by urology. Macrobid prn      Physical Exam: General: The patient is alert and oriented x3 in no acute distress.  Dermatology: Skin is warm, dry and supple bilateral lower extremities. Negative for open lesions or macerations.  Vascular: Palpable pedal pulses bilaterally. No edema or erythema noted. Capillary refill within normal limits.  Neurological: Epicritic and protective threshold grossly intact bilaterally.   Musculoskeletal Exam: Pain on palpation noted to the posterior tubercle of the right calcaneus at the insertion of the Achilles tendon consistent with retrocalcaneal bursitis. Range of motion within normal limits. Muscle strength 5/5 in all muscle groups bilateral lower extremities.  Radiographic Exam:  Posterior calcaneal spur noted to the respective calcaneus on lateral view. No fracture or dislocation noted. Normal osseous mineralization noted.     Assessment: 1. Insertional Achilles tendinitis right medial  2. Retrocalcaneal bursitis   Plan of Care:  1. Patient was evaluated. Radiographs were reviewed  today. 2. Injection of 0.5 mL Celestone Soluspan injected into the retrocalcaneal bursa. Care was taken to avoid direct injection into the Achilles tendon. 3. Prescription for Medrol Dose Pak provided to patient.  4. Prescription for Meloxicam provided to patient.  5. Night splint dispensed.  6. Stressed importance of stretching exercises.  7. Return to clinic in 4 weeks.    Edrick Kins, DPM [facility]  Dr. Edrick Kins, [address]                                        [place], [place] [address]                Office [phone]  Fax [phone]

## 2017-10-17 ENCOUNTER — Encounter: Payer: Self-pay | Admitting: Obstetrics and Gynecology

## 2017-10-24 ENCOUNTER — Ambulatory Visit (INDEPENDENT_AMBULATORY_CARE_PROVIDER_SITE_OTHER): Payer: 59 | Admitting: Podiatry

## 2017-10-24 ENCOUNTER — Encounter: Payer: Self-pay | Admitting: Podiatry

## 2017-10-24 DIAGNOSIS — M7661 Achilles tendinitis, right leg: Secondary | ICD-10-CM

## 2017-10-29 NOTE — Progress Notes (Signed)
   HPI: 47 year old female presenting today for follow up evaluation of achilles tendinitis of the right lower extremity. She reports she is doing well. She reports some minor aching intermittently but nothing significant. There are no modifying factors noted. She has been taking Mobic for treatment. Patient is here for further evaluation and treatment.   Past Medical History:  Diagnosis Date  . Allergy    Claritin and Flonas helps manage symptoms  . History of abnormal cervical Pap smear    2009;2013 LGSIL - 10/13/14 neg  . History of mammogram 10/13/2014   neg  . History of Papanicolaou smear of cervix 10/13/2014   neg  . Migraine   . UTI (lower urinary tract infection)    Seen by urology. Macrobid prn      Objective: Physical Exam General: The patient is alert and oriented x3 in no acute distress.  Dermatology: Skin is cool, dry and supple bilateral lower extremities. Negative for open lesions or macerations.  Vascular: Palpable pedal pulses bilaterally. No edema or erythema noted. Capillary refill within normal limits.  Neurological: Epicritic and protective threshold grossly intact bilaterally.   Musculoskeletal Exam: All pedal and ankle joints range of motion within normal limits bilateral. Muscle strength 5/5 in all groups bilateral.     Assessment: 1. Insertional Achilles tendinitis right medial - resolved  2. Retrocalcaneal bursitis - resolved    Plan of Care:  1. Patient was evaluated.  2. Continue taking Mobic and wearing night splint as needed.  3. Continue stretching exercises daily.  4. Return to clinic as needed.    Edrick Kins, DPM [facility]  Dr. Edrick Kins, [address]                                        [address], [place] [address]                Office [phone]  Fax [phone]

## 2017-11-14 ENCOUNTER — Encounter: Payer: Self-pay | Admitting: Obstetrics and Gynecology

## 2017-11-14 ENCOUNTER — Ambulatory Visit (INDEPENDENT_AMBULATORY_CARE_PROVIDER_SITE_OTHER): Payer: 59 | Admitting: Obstetrics and Gynecology

## 2017-11-14 VITALS — BP 120/80 | HR 88 | Ht 69.0 in | Wt 270.0 lb

## 2017-11-14 DIAGNOSIS — Z1231 Encounter for screening mammogram for malignant neoplasm of breast: Secondary | ICD-10-CM | POA: Diagnosis not present

## 2017-11-14 DIAGNOSIS — Z01419 Encounter for gynecological examination (general) (routine) without abnormal findings: Secondary | ICD-10-CM | POA: Diagnosis not present

## 2017-11-14 DIAGNOSIS — Z30431 Encounter for routine checking of intrauterine contraceptive device: Secondary | ICD-10-CM | POA: Diagnosis not present

## 2017-11-14 DIAGNOSIS — Z1239 Encounter for other screening for malignant neoplasm of breast: Secondary | ICD-10-CM

## 2017-11-14 NOTE — Progress Notes (Signed)
Chief Complaint  Patient presents with  . Gynecologic Exam     HPI:      [name] is a 47 y.o. [id number] who LMP was No LMP recorded. (Menstrual status: IUD)., presents today for her annual examination.  Her menses are absent with IUD.  Dysmenorrhea none. She does not have occas intermenstrual bleeding.  Sex activity: single partner, contraception - IUD. Mirena placed 02/20/17 Last Pap: 08/30/16 Results were: no abnormalities /neg HPV DNA  Hx of STDs: HPV  Last mammogram: 08/18/17  Results were: normal--routine follow-up in 12 months. Done with PCP. There is a FH of breast cancer in her mom and possibly MGM (cancer was widespread--no primary site determined), genetic testing not done. There is no FH of ovarian cancer. The patient does do self-breast exams.  Tobacco use: The patient denies current or previous tobacco use. Alcohol use: none Exercise: not active  She does get adequate calcium and Vitamin D in her diet. Labs with PCP.   Past Medical History:  Diagnosis Date  . Allergy    Claritin and Flonas helps manage symptoms  . History of abnormal cervical Pap smear    2009;2013 LGSIL - 10/13/14 neg  . History of mammogram 10/13/2014   neg  . History of Papanicolaou smear of cervix 10/13/2014   neg  . Migraine   . UTI (lower urinary tract infection)    Seen by urology. Macrobid prn    Past Surgical History:  Procedure Laterality Date  . COLPOSCOPY  12/02/2011  . INTRAUTERINE DEVICE (IUD) INSERTION    . SEPTOPLASTY  2000    Family History  Problem Relation Age of Onset  . Cancer Maternal Grandmother 67       colon cancer, breast cancer, brain cancer  . Hyperlipidemia Paternal Grandmother   . Hypertension Paternal Grandmother   . Heart disease Paternal Grandmother 47  . Cancer Paternal Grandmother 76       leukemia  . Hyperlipidemia Paternal Grandfather   . Stroke Paternal Grandfather   . Hypertension Paternal Grandfather   . Aneurysm Paternal  Grandfather   . Heart disease Paternal Grandfather   . Hypothyroidism Father   . Hypothyroidism Sister   . Breast cancer Mother 5  . Heart disease Maternal Grandfather 63  . Cancer Other 74       colon    Social History   Socioeconomic History  . Marital status: Married    Spouse name: [name]  . Number of children: 2  . Years of education: 39  . Highest education level: Not on file  Occupational History  . Occupation: Therapist, sports - Designer, television/film set    Comment: [employer]  . Financial resource strain: Not on file  . Food insecurity:    Worry: Not on file    Inability: Not on file  . Transportation needs:    Medical: Not on file    Non-medical: Not on file  Tobacco Use  . Smoking status: Never Smoker  . Smokeless tobacco: Never Used  Substance and Sexual Activity  . Alcohol use: Yes    Alcohol/week: 0.0 standard drinks    Comment: occ  . Drug use: No  . Sexual activity: Yes    Birth control/protection: IUD, Other-see comments    Comment: mirena  Lifestyle  . Physical activity:    Days per week: Not on file    Minutes per session: Not on file  . Stress: Not on file  Relationships  . Social  connections:    Talks on phone: Not on file    Gets together: Not on file    Attends religious service: Not on file    Active member of club or organization: Not on file    Attends meetings of clubs or organizations: Not on file    Relationship status: Not on file  . Intimate partner violence:    Fear of current or ex partner: Not on file    Emotionally abused: Not on file    Physically abused: Not on file    Forced sexual activity: Not on file  Other Topics Concern  . Not on file  Social History Narrative   Jataya grew up in [place]. She lives at home with her husband, [name], of 3 years. Her youngest son, [name], is a Paramedic in [employer] and lives at home with them. Kylieann has another son, [name], who is away at college at [employer]. Janani attended  [employer] and obtained her Bachelors in Nursing. 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 She also enjoys gardening in the summer. She also quilts.     Current Outpatient Medications:  .  amoxicillin-clavulanate (AUGMENTIN) 875-125 MG tablet, , Disp: , Rfl: 0 .  fluticasone (FLONASE) 50 MCG/ACT nasal spray, Place 1 spray into both nostrils daily., Disp: 16 g, Rfl: 11 .  Loratadine 10 MG CAPS, Take 1 tablet by mouth daily., Disp: , Rfl:  .  Meth-Hyo-M Bl-Na Phos-Ph Sal (URIBEL) 118 MG CAPS, Take 1 capsule (118 mg total) by mouth daily as needed., Disp: 90 capsule, Rfl: 3 .  nitrofurantoin (MACRODANTIN) 50 MG capsule, Take 1 capsule (50 mg total) by mouth as needed., Disp: 30 capsule, Rfl: 1 .  omeprazole (PRILOSEC) 40 MG capsule, Take 1 capsule (40 mg total) by mouth daily., Disp: 90 capsule, Rfl: 3 .  levonorgestrel (MIRENA) 20 MCG/24HR IUD, 1 Intra Uterine Device (1 each total) by Intrauterine route once for 1 dose., Disp: 1 each, Rfl: 0  ROS:  Review of Systems  Constitutional: Negative for fatigue, fever and unexpected weight change.  Respiratory: Negative for cough, shortness of breath and wheezing.   Cardiovascular: Negative for chest pain, palpitations and leg swelling.  Gastrointestinal: Negative for blood in stool, constipation, diarrhea, nausea and vomiting.  Endocrine: Negative for cold intolerance, heat intolerance and polyuria.  Genitourinary: Negative for dyspareunia, dysuria, flank pain, frequency, genital sores, hematuria, menstrual problem, pelvic pain, urgency, vaginal bleeding, vaginal discharge and vaginal pain.  Musculoskeletal: Negative for back pain, joint swelling and myalgias.  Skin: Negative for rash.  Neurological: Negative for dizziness, syncope, light-headedness, numbness and headaches.  Hematological: Negative for adenopathy.  Psychiatric/Behavioral: Negative for agitation, confusion, sleep disturbance and suicidal ideas.  The patient is not nervous/anxious.      Objective: BP 120/80   Pulse 88   Ht 5\' 9"  (1.753 m)   Wt 270 lb (122.5 kg)   BMI 39.87 kg/m    Physical Exam  Constitutional: She is oriented to person, place, and time. She appears well-developed and well-nourished.  Genitourinary: Vagina normal and uterus normal. There is no rash or tenderness on the right labia. There is no rash or tenderness on the left labia. No erythema or tenderness in the vagina. No vaginal discharge found. Right adnexum does not display mass and does not display tenderness. Left adnexum does not display mass and does not display tenderness.  Cervix exhibits visible IUD strings. Cervix does not exhibit motion tenderness or  polyp. Uterus is not enlarged or tender.  Neck: Normal range of motion. No thyromegaly present.  Cardiovascular: Normal rate, regular rhythm and normal heart sounds.  No murmur heard. Pulmonary/Chest: Effort normal and breath sounds normal. Right breast exhibits no mass, no nipple discharge, no skin change and no tenderness. Left breast exhibits no mass, no nipple discharge, no skin change and no tenderness.  Abdominal: Soft. There is no tenderness. There is no guarding.  Musculoskeletal: Normal range of motion.  Neurological: She is alert and oriented to person, place, and time. No cranial nerve deficit.  Psychiatric: She has a normal mood and affect. Her behavior is normal.  Vitals reviewed.   Assessment/Plan: Encounter for annual routine gynecological examination  Screening for breast cancer - Pt current on mammo.  Encounter for routine checking of intrauterine contraceptive device (IUD) - IUD in place. Doing well.             GYN counsel mammography screening, adequate intake of calcium and vitamin D     F/U  Return in about 1 year (around 11/15/2018).  Elizabeth Paulsen B. Preet Perrier, PA-C 11/14/2017 4:28 PM

## 2017-11-14 NOTE — Patient Instructions (Signed)
I value your feedback and entrusting us with your care. If you get a [facility] patient survey, I would appreciate you taking the time to let us know about your experience today. Thank you! 

## 2017-12-05 ENCOUNTER — Ambulatory Visit: Payer: Self-pay | Admitting: Family Medicine

## 2017-12-05 ENCOUNTER — Encounter: Payer: Self-pay | Admitting: Family Medicine

## 2017-12-05 VITALS — BP 132/92 | HR 96 | Temp 98.4°F | Wt 250.0 lb

## 2017-12-05 DIAGNOSIS — J069 Acute upper respiratory infection, unspecified: Secondary | ICD-10-CM

## 2017-12-05 DIAGNOSIS — R05 Cough: Secondary | ICD-10-CM

## 2017-12-05 DIAGNOSIS — R059 Cough, unspecified: Secondary | ICD-10-CM

## 2017-12-05 DIAGNOSIS — R062 Wheezing: Secondary | ICD-10-CM

## 2017-12-05 MED ORDER — AZITHROMYCIN 250 MG PO TABS: ORAL_TABLET | ORAL | 0 refills | Status: DC

## 2017-12-05 MED ORDER — BENZONATATE 100 MG PO CAPS: 100.0000 mg | ORAL_CAPSULE | Freq: Three times a day (TID) | ORAL | 0 refills | Status: DC | PRN

## 2017-12-05 MED ORDER — PREDNISONE 20 MG PO TABS: 40.0000 mg | ORAL_TABLET | Freq: Every day | ORAL | 0 refills | Status: DC

## 2017-12-05 NOTE — Progress Notes (Signed)
Patient ID: Susan Bentley, female    DOB: 29-Mar-1970, 47 y.o.   MRN: [id number]  PCP: Susan Haven, MD  Chief Complaint  Patient presents with  . Cough  . chest congestion   Subjective:  HPI Susan Bentley is a 47 y.o. female presents for evaluation cough, wheezing, chest congestion. Upper Respiratory Infection: Onset of symptoms was 5 days ago, gradually worsening since that time. She also c/o non-productive cough which is worsened at night, chest tightness with activity, some shortness of breath, and wheezing occurring mostly at night or with activity. Afebrile. No known history of asthma although suffers from chronic allergies. She has attempted OTC medication without improvement. Recent sick contact-husband ill with respiratory illness.  Social History   Socioeconomic History  . Marital status: Married    Spouse name: Susan Bentley  . Number of children: 2  . Years of education: 57  . Highest education level: Not on file  Occupational History  . Occupation: Therapist, sports - Designer, television/film set    Comment: [employer]  . Financial resource strain: Not on file  . Food insecurity:    Worry: Not on file    Inability: Not on file  . Transportation needs:    Medical: Not on file    Non-medical: Not on file  Tobacco Use  . Smoking status: Never Smoker  . Smokeless tobacco: Never Used  Substance and Sexual Activity  . Alcohol use: Yes    Alcohol/week: 0.0 standard drinks    Comment: occ  . Drug use: No  . Sexual activity: Yes    Birth control/protection: IUD, Other-see comments    Comment: mirena  Lifestyle  . Physical activity:    Days per week: Not on file    Minutes per session: Not on file  . Stress: Not on file  Relationships  . Social connections:    Talks on phone: Not on file    Gets together: Not on file    Attends religious service: Not on file    Active member of club or organization: Not on file    Attends meetings of clubs or organizations: Not on file   Relationship status: Not on file  . Intimate partner violence:    Fear of current or ex partner: Not on file    Emotionally abused: Not on file    Physically abused: Not on file    Forced sexual activity: Not on file  Other Topics Concern  . Not on file  Social History Narrative   Susan Bentley grew up in [place]. She lives at home with her husband, Susan Bentley, of 3 years. Her youngest son, Susan Bentley, is a Paramedic in [employer] and lives at home with them. Latanza has another son, Susan Bentley, who is away at college at [employer]. Susan Bentley attended [employer] and obtained her Bachelors in Nursing. 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 She also enjoys gardening in the summer. She also quilts.    Family History  Problem Relation Age of Onset  . Cancer Maternal Grandmother 31       colon cancer, breast cancer, brain cancer  . Hyperlipidemia Paternal Grandmother   . Hypertension Paternal Grandmother   . Heart disease Paternal Grandmother 43  . Cancer Paternal Grandmother 9       leukemia  . Hyperlipidemia Paternal Grandfather   . Stroke Paternal Grandfather   . Hypertension Paternal Grandfather   . Aneurysm Paternal Grandfather   . Heart  disease Paternal Grandfather   . Hypothyroidism Father   . Hypothyroidism Sister   . Breast cancer Mother 68  . Heart disease Maternal Grandfather 62  . Cancer Other 70       colon   Review of Systems Pertinent negatives listed in HPI Patient Active Problem List   Diagnosis Date Noted  . Routine general medical examination at a health care facility 03/29/2013  . GERD (gastroesophageal reflux disease) 03/29/2013  . History of recurrent UTI (urinary tract infection) 03/29/2013  . Headache 03/29/2013    Allergies  Allergen Reactions  . Codeine Hives  . Sulfa Antibiotics Other (See Comments)    Dizzy and lightheadedness    Prior to Admission medications   Medication Sig Start Date End Date Taking? Authorizing  Provider  fluticasone (FLONASE) 50 MCG/ACT nasal spray Place 1 spray into both nostrils daily. 07/19/16  Yes Susan Haven, MD  Loratadine 10 MG CAPS Take 1 tablet by mouth daily.   Yes [provider]  Meth-Hyo-M Bl-Na Phos-Ph Sal (URIBEL) 118 MG CAPS Take 1 capsule (118 mg total) by mouth daily as needed. 06/29/15  Yes Doss, Velora Heckler, RN  nitrofurantoin (MACRODANTIN) 50 MG capsule Take 1 capsule (50 mg total) by mouth as needed. 09/22/17  Yes Susan Haven, MD  omeprazole (PRILOSEC) 40 MG capsule Take 1 capsule (40 mg total) by mouth daily. 06/29/15  Yes DossVelora Heckler, RN  amoxicillin-clavulanate (AUGMENTIN) [phone] MG tablet  11/06/17   [provider]  levonorgestrel (MIRENA) 20 MCG/24HR IUD 1 Intra Uterine Device (1 each total) by Intrauterine route once for 1 dose. 02/20/17 1/85/63  Copland, Deirdre Evener, PA-C    Past Medical, Surgical Family and Social History reviewed and updated.    Objective:   Today's Vitals   12/05/17 1538  BP: (!) 132/92  Pulse: 96  Temp: 98.4 F (36.9 C)  SpO2: 96%  Weight: 250 lb (113.4 kg)    Wt Readings from Last 3 Encounters:  12/05/17 250 lb (113.4 kg)  11/14/17 270 lb (122.5 kg)  07/21/17 265 lb 9.6 oz (120.5 kg)    Physical Exam  Constitutional:  Appears ill and fatigued.  HENT:  Head: Normocephalic and atraumatic.  Nose: Nose normal.  Mouth/Throat: Oropharynx is clear and moist.  Eyes: Pupils are equal, round, and reactive to light. EOM are normal.  Cardiovascular: Normal rate, regular rhythm and normal heart sounds.  Pulmonary/Chest: Effort normal and breath sounds normal. She has no wheezes. She has no rales. She exhibits no tenderness.  Dry, hacking type cough noted on exam  Lymphadenopathy:    She has no cervical adenopathy.  Skin: Skin is warm and dry.   Assessment & Plan:  1. Upper respiratory tract infection, unspecified type 2. Cough 3. Wheezing  Suspect symptoms are worsening to possible bronchitis  given current symptoms. No history of asthma. However will treat with a very short course of prednisone to relieve chest tightness and promote better air movement. Azithromycin for URI/bronichitis and benzonatate for cough. Rest and hydration encouraged.  Meds ordered this encounter  Medications  . azithromycin (ZITHROMAX) 250 MG tablet    Sig: Take 2 tabs PO x 1 dose, then 1 tab PO QD x 4 days    Dispense:  6 tablet    Refill:  0  . predniSONE (DELTASONE) 20 MG tablet    Sig: Take 2 tablets (40 mg total) by mouth daily with breakfast.    Dispense:  6 tablet    Refill:  0  . benzonatate (TESSALON) 100 MG capsule    Sig: Take 1-2 capsules (100-200 mg total) by mouth 3 (three) times daily as needed for cough.    Dispense:  40 capsule    Refill:  0    If symptoms worsen or do not improve, return for follow-up, follow-up with PCP, or at the emergency department if severity of symptoms warrant a higher level of care.     Carroll Sage. Kenton Kingfisher, MSN, FNP-C [facility]  [place], [place] [address] [phone]

## 2017-12-05 NOTE — Patient Instructions (Signed)
I am treating you for URI which could be possible bronchitis.  Start prednisone 40 mg x 3 days. I am also placing you on Zpak.  Start Azithromycin Take 2 tabs x 1 dose, then 1 tab every day for x 4 days.  I am also prescribing benzonatate , take 100-200 mg up to 3 times daily.   Upper Respiratory Infection, Adult Most upper respiratory infections (URIs) are caused by a virus. A URI affects the nose, throat, and upper air passages. The most common type of URI is often called "the common cold." Follow these instructions at home:  Take medicines only as told by your doctor.  Gargle warm saltwater or take cough drops to comfort your throat as told by your doctor.  Use a warm mist humidifier or inhale steam from a shower to increase air moisture. This may make it easier to breathe.  Drink enough fluid to keep your pee (urine) clear or pale yellow.  Eat soups and other clear broths.  Have a healthy diet.  Rest as needed.  Go back to work when your fever is gone or your doctor says it is okay. ? You may need to stay home longer to avoid giving your URI to others. ? You can also wear a face mask and wash your hands often to prevent spread of the virus.  Use your inhaler more if you have asthma.  Do not use any tobacco products, including cigarettes, chewing tobacco, or electronic cigarettes. If you need help quitting, ask your doctor. Contact a doctor if:  You are getting worse, not better.  Your symptoms are not helped by medicine.  You have chills.  You are getting more short of breath.  You have brown or red mucus.  You have yellow or brown discharge from your nose.  You have pain in your face, especially when you bend forward.  You have a fever.  You have puffy (swollen) neck glands.  You have pain while swallowing.  You have white areas in the back of your throat. Get help right away if:  You have very bad or constant: ? Headache. ? Ear pain. ? Pain in your  forehead, behind your eyes, and over your cheekbones (sinus pain). ? Chest pain.  You have long-lasting (chronic) lung disease and any of the following: ? Wheezing. ? Long-lasting cough. ? Coughing up blood. ? A change in your usual mucus.  You have a stiff neck.  You have changes in your: ? Vision. ? Hearing. ? Thinking. ? Mood. This information is not intended to replace advice given to you by your health care provider. Make sure you discuss any questions you have with your health care provider. Document Released: 08/31/2007 Document Revised: 11/15/2015 Document Reviewed: 06/19/2013 Elsevier Interactive Patient Education  2018 [employer].     Acute Bronchitis, Adult Acute bronchitis is when air tubes (bronchi) in the lungs suddenly get swollen. The condition can make it hard to breathe. It can also cause these symptoms:  A cough.  Coughing up clear, yellow, or green mucus.  Wheezing.  Chest congestion.  Shortness of breath.  A fever.  Body aches.  Chills.  A sore throat.  Follow these instructions at home: Medicines  Take over-the-counter and prescription medicines only as told by your doctor.  If you were prescribed an antibiotic medicine, take it as told by your doctor. Do not stop taking the antibiotic even if you start to feel better. General instructions  Rest.  Drink enough  fluids to keep your pee (urine) clear or pale yellow.  Avoid smoking and secondhand smoke. If you smoke and you need help quitting, ask your doctor. Quitting will help your lungs heal faster.  Use an inhaler, cool mist vaporizer, or humidifier as told by your doctor.  Keep all follow-up visits as told by your doctor. This is important. How is this prevented? To lower your risk of getting this condition again:  Wash your hands often with soap and water. If you cannot use soap and water, use hand sanitizer.  Avoid contact with people who have cold symptoms.  Try not to  touch your hands to your mouth, nose, or eyes.  Make sure to get the flu shot every year.  Contact a doctor if:  Your symptoms do not get better in 2 weeks. Get help right away if:  You cough up blood.  You have chest pain.  You have very bad shortness of breath.  You become dehydrated.  You faint (pass out) or keep feeling like you are going to pass out.  You keep throwing up (vomiting).  You have a very bad headache.  Your fever or chills gets worse. This information is not intended to replace advice given to you by your health care provider. Make sure you discuss any questions you have with your health care provider. Document Released: 08/31/2007 Document Revised: 10/21/2015 Document Reviewed: 09/02/2015 Elsevier Interactive Patient Education  [employer].

## 2017-12-07 ENCOUNTER — Telehealth: Payer: Self-pay | Admitting: Emergency Medicine

## 2017-12-07 NOTE — Telephone Encounter (Signed)
Spoke with patient stated that she is doing better then she was. Still coughing.

## 2018-05-10 DIAGNOSIS — H52223 Regular astigmatism, bilateral: Secondary | ICD-10-CM | POA: Diagnosis not present

## 2018-05-10 DIAGNOSIS — H524 Presbyopia: Secondary | ICD-10-CM | POA: Diagnosis not present

## 2018-05-10 DIAGNOSIS — H5213 Myopia, bilateral: Secondary | ICD-10-CM | POA: Diagnosis not present

## 2018-07-09 ENCOUNTER — Other Ambulatory Visit: Payer: Self-pay | Admitting: Family Medicine

## 2018-07-09 DIAGNOSIS — Z1231 Encounter for screening mammogram for malignant neoplasm of breast: Secondary | ICD-10-CM

## 2018-07-27 ENCOUNTER — Encounter: Payer: Self-pay | Admitting: Family Medicine

## 2018-08-10 ENCOUNTER — Encounter: Payer: Self-pay | Admitting: Family Medicine

## 2018-08-22 DIAGNOSIS — D225 Melanocytic nevi of trunk: Secondary | ICD-10-CM | POA: Diagnosis not present

## 2018-08-22 DIAGNOSIS — D2261 Melanocytic nevi of right upper limb, including shoulder: Secondary | ICD-10-CM | POA: Diagnosis not present

## 2018-08-22 DIAGNOSIS — D2271 Melanocytic nevi of right lower limb, including hip: Secondary | ICD-10-CM | POA: Diagnosis not present

## 2018-08-22 DIAGNOSIS — X32XXXA Exposure to sunlight, initial encounter: Secondary | ICD-10-CM | POA: Diagnosis not present

## 2018-08-22 DIAGNOSIS — D2272 Melanocytic nevi of left lower limb, including hip: Secondary | ICD-10-CM | POA: Diagnosis not present

## 2018-08-22 DIAGNOSIS — D485 Neoplasm of uncertain behavior of skin: Secondary | ICD-10-CM | POA: Diagnosis not present

## 2018-08-22 DIAGNOSIS — D2262 Melanocytic nevi of left upper limb, including shoulder: Secondary | ICD-10-CM | POA: Diagnosis not present

## 2018-09-14 ENCOUNTER — Other Ambulatory Visit: Payer: Self-pay

## 2018-09-14 ENCOUNTER — Ambulatory Visit
Admission: RE | Admit: 2018-09-14 | Discharge: 2018-09-14 | Disposition: A | Discharge status: Home / Self Care | Payer: 59 | Source: Ambulatory Visit | Attending: Family Medicine | Admitting: Family Medicine

## 2018-09-14 DIAGNOSIS — Z1231 Encounter for screening mammogram for malignant neoplasm of breast: Secondary | ICD-10-CM

## 2018-10-11 ENCOUNTER — Other Ambulatory Visit: Payer: Self-pay

## 2018-10-11 LAB — LIPID PANEL
Cholesterol: 189 (ref 0–200)
HDL: 32 — AB (ref 35–70)
LDL Cholesterol: 109
LDl/HDL Ratio: 5.9
LDl/HDL Ratio: 6.7
Triglycerides: 238 — AB (ref 40–160)

## 2018-10-11 LAB — BASIC METABOLIC PANEL
BUN: 11 (ref 4–21)
Creatinine: 0.8 (ref <=1.1)
Glucose: 89
Potassium: 4.8 (ref 3.4–5.3)
Sodium: 138 (ref 137–147)

## 2018-10-11 LAB — VITAMIN D 25 HYDROXY (VIT D DEFICIENCY, FRACTURES): Vit D, 25-Hydroxy: 36.9

## 2018-10-11 LAB — CBC AND DIFFERENTIAL
HCT: 40 (ref 36–46)
Hemoglobin: 12.8 (ref 12.0–16.0)
Neutrophils Absolute: 5
Platelets: 370 (ref 150–399)
WBC: 7.6

## 2018-10-11 LAB — HEMOGLOBIN A1C: Hemoglobin A1C: 5.5

## 2018-10-11 LAB — TSH: TSH: 1.85 (ref <=5.90)

## 2018-10-17 ENCOUNTER — Ambulatory Visit (INDEPENDENT_AMBULATORY_CARE_PROVIDER_SITE_OTHER): Payer: 59 | Admitting: Family Medicine

## 2018-10-17 ENCOUNTER — Encounter: Payer: Self-pay | Admitting: Family Medicine

## 2018-10-17 ENCOUNTER — Other Ambulatory Visit: Payer: Self-pay

## 2018-10-17 VITALS — BP 120/70 | HR 83 | Temp 98.1°F | Ht 68.0 in | Wt 256.4 lb

## 2018-10-17 DIAGNOSIS — E781 Pure hyperglyceridemia: Secondary | ICD-10-CM

## 2018-10-17 DIAGNOSIS — Z0000 Encounter for general adult medical examination without abnormal findings: Secondary | ICD-10-CM | POA: Diagnosis not present

## 2018-10-17 DIAGNOSIS — Z800 Family history of malignant neoplasm of digestive organs: Secondary | ICD-10-CM | POA: Diagnosis not present

## 2018-10-17 DIAGNOSIS — E790 Hyperuricemia without signs of inflammatory arthritis and tophaceous disease: Secondary | ICD-10-CM | POA: Diagnosis not present

## 2018-10-17 MED ORDER — NITROFURANTOIN MACROCRYSTAL 50 MG PO CAPS: 50.0000 mg | ORAL_CAPSULE | ORAL | 1 refills | Status: DC | PRN

## 2018-10-17 NOTE — Assessment & Plan Note (Signed)
Physical exam completed.  Encouraged increasing exercise by walking 2 to 3 days a week.  Continue with dietary changes.  Will refer to GI given her father's recent colon cancer diagnosis.  She will continue to follow with gynecology for pelvic exams and breast exams.  Lab work will be scanned into the computer.

## 2018-10-17 NOTE — Progress Notes (Signed)
[name], MD Phone: [phone]  Susan Bentley is a 48 y.o. female who presents today for CPE.  Exercise: Not exercising as much given the COVID-19 pandemic. Diet has been better.  More vegetables.  She is cut out refined sugar.  She is cut down on snacking after dinner. Pap smear 08/30/2017 negative cells and negative HPV. Mammogram up-to-date 09/17/2018 and was negative. Prior colonoscopy in 2013. She has an IUD and only has a menstrual cycle every 3 months.  She is followed by gynecology for that.  She is sexually active with only her husband. She has a family history of breast cancer in her mother.  Her maternal grandmother had cancer though they are unsure what kind.  No known family history of ovarian cancer.  Her father was recently diagnosed with colon cancer at age 87. Tetanus vaccine up-to-date 10/07/2015. No tobacco use, alcohol use, or illicit drug use. Sees a dentist and an ophthalmologist. Triglycerides elevated on labs. Uric acid elevated mildly on labs.  No history of gout.  Active Ambulatory Problems    Diagnosis Date Noted  . Routine general medical examination at a health care facility 03/29/2013  . GERD (gastroesophageal reflux disease) 03/29/2013  . History of recurrent UTI (urinary tract infection) 03/29/2013  . Headache 03/29/2013  . Hypertriglyceridemia 10/17/2018  . Elevated uric acid in blood 10/17/2018  . Family history of colon cancer in father 10/17/2018   Resolved Ambulatory Problems    Diagnosis Date Noted  . No Resolved Ambulatory Problems   Past Medical History:  Diagnosis Date  . Allergy   . History of abnormal cervical Pap smear   . History of mammogram 10/13/2014  . History of Papanicolaou smear of cervix 10/13/2014  . Migraine   . UTI (lower urinary tract infection)     Family History  Problem Relation Age of Onset  . Cancer Maternal Grandmother 58       colon cancer, breast cancer, brain cancer  . Hyperlipidemia Paternal  Grandmother   . Hypertension Paternal Grandmother   . Heart disease Paternal Grandmother 87  . Cancer Paternal Grandmother 76       leukemia  . Hyperlipidemia Paternal Grandfather   . Stroke Paternal Grandfather   . Hypertension Paternal Grandfather   . Aneurysm Paternal Grandfather   . Heart disease Paternal Grandfather   . Hypothyroidism Father   . Hypothyroidism Sister   . Breast cancer Mother 29  . Heart disease Maternal Grandfather 33  . Cancer Other 11       colon    Social History   Socioeconomic History  . Marital status: Married    Spouse name: [name]  . Number of children: 2  . Years of education: 23  . Highest education level: Not on file  Occupational History  . Occupation: Therapist, sports - Designer, television/film set    Comment: [employer]  . Financial resource strain: Not on file  . Food insecurity    Worry: Not on file    Inability: Not on file  . Transportation needs    Medical: Not on file    Non-medical: Not on file  Tobacco Use  . Smoking status: Never Smoker  . Smokeless tobacco: Never Used  Substance and Sexual Activity  . Alcohol use: Yes    Alcohol/week: 0.0 standard drinks    Comment: occ  . Drug use: No  . Sexual activity: Yes    Birth control/protection: I.U.D., Other-see comments    Comment: mirena  Lifestyle  .  Physical activity    Days per week: Not on file    Minutes per session: Not on file  . Stress: Not on file  Relationships  . Social Herbalist on phone: Not on file    Gets together: Not on file    Attends religious service: Not on file    Active member of club or organization: Not on file    Attends meetings of clubs or organizations: Not on file    Relationship status: Not on file  . Intimate partner violence    Fear of current or ex partner: Not on file    Emotionally abused: Not on file    Physically abused: Not on file    Forced sexual activity: Not on file  Other Topics Concern  . Not on file  Social History  Narrative   [name] grew up in [place]. She lives at home with her husband, [name], of 3 years. Her youngest son, [name], is a Paramedic in [employer] and lives at home with them. [name] has another son, [name], who is away at college at [employer]. [name] attended [employer] and obtained her Bachelors in Nursing. 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 She also enjoys gardening in the summer. She also quilts.    ROS  General:  Negative for nexplained weight loss, fever Skin: Negative for new or changing mole, sore that won't heal HEENT: Negative for trouble hearing, trouble seeing, ringing in ears, mouth sores, hoarseness, change in voice, dysphagia. CV:  Negative for chest pain, dyspnea, edema, palpitations Resp: Negative for cough, dyspnea, hemoptysis GI: Negative for nausea, vomiting, diarrhea, constipation, abdominal pain, melena, hematochezia. GU: Negative for dysuria, incontinence, urinary hesitance, hematuria, vaginal or penile discharge, polyuria, sexual difficulty, lumps in testicle or breasts MSK: Negative for muscle cramps or aches, joint pain or swelling Neuro: Negative for headaches, weakness, numbness, dizziness, passing out/fainting Psych: Negative for depression, anxiety, memory problems  Objective  Physical Exam Vitals:   10/17/18 1406  BP: 120/70  Pulse: 83  Temp: 98.1 F (36.7 C)  SpO2: 97%    BP Readings from Last 3 Encounters:  10/17/18 120/70  12/05/17 (!) 132/92  11/14/17 120/80   Wt Readings from Last 3 Encounters:  10/17/18 256 lb 6.4 oz (116.3 kg)  12/05/17 250 lb (113.4 kg)  11/14/17 270 lb (122.5 kg)    Physical Exam Constitutional:      General: She is not in acute distress.    Appearance: She is not diaphoretic.  HENT:     Head: Normocephalic and atraumatic.  Eyes:     Conjunctiva/sclera: Conjunctivae normal.     Pupils: Pupils are equal, round, and reactive to light.  Cardiovascular:     Rate  and Rhythm: Normal rate and regular rhythm.     Heart sounds: Normal heart sounds.  Pulmonary:     Effort: Pulmonary effort is normal.     Breath sounds: Normal breath sounds.  Abdominal:     General: Bowel sounds are normal. There is no distension.     Palpations: Abdomen is soft.     Tenderness: There is no abdominal tenderness. There is no guarding or rebound.  Musculoskeletal:     Right lower leg: No edema.     Left lower leg: No edema.  Lymphadenopathy:     Cervical: No cervical adenopathy.  Skin:    General: Skin is warm and dry.  Neurological:  Mental Status: She is alert.      Assessment/Plan:   Routine general medical examination at a health care facility Physical exam completed.  Encouraged increasing exercise by walking 2 to 3 days a week.  Continue with dietary changes.  Will refer to GI given her father's recent colon cancer diagnosis.  She will continue to follow with gynecology for pelvic exams and breast exams.  Lab work will be scanned into the computer.  Hypertriglyceridemia Encouraged diet and exercise.  Repeat lipid panel in 6 months and follow-up at that time.  Elevated uric acid in blood No history of gouty flare.  We will plan to recheck uric acid in 1 month through her employee clinic.  Family history of colon cancer in father Refer to GI for colonoscopy.   Orders Placed This Encounter  Procedures  . Ambulatory referral to Gastroenterology    Referral Priority:   Routine    Referral Type:   Consultation    Referral Reason:   Specialty Services Required    Number of Visits Requested:   1    Meds ordered this encounter  Medications  . nitrofurantoin (MACRODANTIN) 50 MG capsule    Sig: Take 1 capsule (50 mg total) by mouth as needed.    Dispense:  30 capsule    Refill:  [place], MD [place]

## 2018-10-17 NOTE — Assessment & Plan Note (Addendum)
No history of gouty flare.  We will plan to recheck uric acid in 1 month through her employee clinic.

## 2018-10-17 NOTE — Assessment & Plan Note (Signed)
Refer to GI for colonoscopy.

## 2018-10-17 NOTE — Assessment & Plan Note (Signed)
Encouraged diet and exercise.  Repeat lipid panel in 6 months and follow-up at that time.

## 2018-10-17 NOTE — Patient Instructions (Signed)
Nice to see you. Please add in exercise by walking 2 to 3 days a week.  Please continue with your diet changes. Please have a uric acid checked in 1 month. Please have your lipid panel checked again in 6 months.

## 2018-12-24 DIAGNOSIS — Z800 Family history of malignant neoplasm of digestive organs: Secondary | ICD-10-CM | POA: Diagnosis not present

## 2018-12-24 DIAGNOSIS — Z01818 Encounter for other preprocedural examination: Secondary | ICD-10-CM | POA: Diagnosis not present

## 2018-12-24 DIAGNOSIS — Z1211 Encounter for screening for malignant neoplasm of colon: Secondary | ICD-10-CM | POA: Diagnosis not present

## 2019-02-06 DIAGNOSIS — Z01818 Encounter for other preprocedural examination: Secondary | ICD-10-CM | POA: Diagnosis not present

## 2019-02-11 DIAGNOSIS — K5730 Diverticulosis of large intestine without perforation or abscess without bleeding: Secondary | ICD-10-CM | POA: Diagnosis not present

## 2019-02-11 DIAGNOSIS — K640 First degree hemorrhoids: Secondary | ICD-10-CM | POA: Diagnosis not present

## 2019-02-11 DIAGNOSIS — Z800 Family history of malignant neoplasm of digestive organs: Secondary | ICD-10-CM | POA: Diagnosis not present

## 2019-02-11 DIAGNOSIS — Z1211 Encounter for screening for malignant neoplasm of colon: Secondary | ICD-10-CM | POA: Diagnosis not present

## 2019-02-11 LAB — HM COLONOSCOPY

## 2019-03-19 DIAGNOSIS — M7542 Impingement syndrome of left shoulder: Secondary | ICD-10-CM | POA: Diagnosis not present

## 2019-03-19 DIAGNOSIS — M7540 Impingement syndrome of unspecified shoulder: Secondary | ICD-10-CM | POA: Insufficient documentation

## 2019-04-03 ENCOUNTER — Encounter: Payer: Self-pay | Admitting: Family Medicine

## 2019-04-03 DIAGNOSIS — G8929 Other chronic pain: Secondary | ICD-10-CM

## 2019-04-04 ENCOUNTER — Telehealth: Payer: Self-pay | Admitting: Family Medicine

## 2019-04-04 NOTE — Telephone Encounter (Signed)
error 

## 2019-04-10 LAB — LIPID PANEL
Cholesterol: 221 — AB (ref 0–200)
Cholesterol: 222 — AB (ref 0–200)
HDL: 33 — AB (ref 35–70)
HDL: 39 (ref 35–70)
LDL Cholesterol: 151
LDL Cholesterol: 155
Triglycerides: 155 (ref 40–160)
Triglycerides: 200 — AB (ref 40–160)

## 2019-04-23 ENCOUNTER — Ambulatory Visit: Payer: 59 | Admitting: Family Medicine

## 2019-05-28 ENCOUNTER — Other Ambulatory Visit: Payer: Self-pay

## 2019-05-28 ENCOUNTER — Encounter: Payer: Self-pay | Admitting: Family Medicine

## 2019-05-28 ENCOUNTER — Ambulatory Visit (INDEPENDENT_AMBULATORY_CARE_PROVIDER_SITE_OTHER): Payer: No Typology Code available for payment source | Admitting: Family Medicine

## 2019-05-28 DIAGNOSIS — G43909 Migraine, unspecified, not intractable, without status migrainosus: Secondary | ICD-10-CM | POA: Insufficient documentation

## 2019-05-28 DIAGNOSIS — E781 Pure hyperglyceridemia: Secondary | ICD-10-CM

## 2019-05-28 DIAGNOSIS — G43009 Migraine without aura, not intractable, without status migrainosus: Secondary | ICD-10-CM

## 2019-05-28 DIAGNOSIS — Z87440 Personal history of urinary (tract) infections: Secondary | ICD-10-CM | POA: Diagnosis not present

## 2019-05-28 DIAGNOSIS — K219 Gastro-esophageal reflux disease without esophagitis: Secondary | ICD-10-CM

## 2019-05-28 MED ORDER — NITROFURANTOIN MACROCRYSTAL 50 MG PO CAPS: 50.0000 mg | ORAL_CAPSULE | ORAL | 1 refills | Status: DC | PRN

## 2019-05-28 MED ORDER — SUMATRIPTAN SUCCINATE 50 MG PO TABS: 50.0000 mg | ORAL_TABLET | Freq: Once | ORAL | 0 refills | Status: DC

## 2019-05-28 NOTE — Assessment & Plan Note (Signed)
Intermittent GERD with some mild dysphagia.  Will refer to GI.  She will continue omeprazole.

## 2019-05-28 NOTE — Assessment & Plan Note (Signed)
Doing well with as needed Macrobid with intercourse.  She will monitor for any UTIs.

## 2019-05-28 NOTE — Assessment & Plan Note (Signed)
Continue diet and exercise. 

## 2019-05-28 NOTE — Assessment & Plan Note (Signed)
History of migraines that do not respond to over-the-counter medications.  We will trial Imitrex to see if that is beneficial.

## 2019-05-28 NOTE — Progress Notes (Signed)
Virtual Visit via video Note  This visit type was conducted due to national recommendations for restrictions regarding the COVID-19 pandemic (e.g. social distancing).  This format is felt to be most appropriate for this patient at this time.  All issues noted in this document were discussed and addressed.  No physical exam was performed (except for noted visual exam findings with Video Visits).   I connected with Susan Bentley today at  8:30 AM EST by a video enabled telemedicine application and verified that I am speaking with the correct person using two identifiers. Location patient: home Location provider: work Persons participating in the virtual visit: patient, provider  I discussed the limitations, risks, security and privacy concerns of performing an evaluation and management service by telephone and the availability of in person appointments. I also discussed with the patient that there may be a patient responsible charge related to this service. The patient expressed understanding and agreed to proceed.  Reason for visit: follow-up  HPI: GERD:   Reflux symptoms: 3x/week with burning in throat   Abd pain: no   Blood in stool: no  Dysphagia: once a week with feeling like hot and cold things get stuck in her lower throat   EGD: no history  Medication: omeprazole 3 days a week  HYPERLIPIDEMIA Symptoms Chest pain on exertion:  no   Leg claudication:   no Medications: Compliance- not on medication Has not been exercising much though has cut down on portion sizes.  Not as many snacks.  She down about 15 pounds.  Triglycerides improved though LDL went up. The 10-year ASCVD risk score Mikey Bussing DC Brooke Bonito., et al., 2013) is: 1.5%*   Values used to calculate the score:     Age: 49 years     Sex: Female     Is Non-Hispanic African American: No     Diabetic: No     Tobacco smoker: No     Systolic Blood Pressure: [id number] mmHg     Is BP treated: No     HDL Cholesterol: 39 mg/dL*     Total  Cholesterol: 222 mg/dL*     * - Cholesterol units were assumed for this score calculation  Recurrent UTIs: Patient has had no recent UTIs.  She takes Macrobid after intercourse.  Migraines: Patient notes a 20+ year history of migraines.  They occur about 3 times a year and last for several days when they do occur.  She has to lay down to help them go away.  She tries over-the-counter ibuprofen and Tylenol at the first sign of the headache and that does not always help.  Starts in the back of her head and wraps around her eyes with a pounding headache.  No nausea or vomiting.  She does have photophobia and phonophobia.  No aura.  No numbness or weakness.  ROS: See pertinent positives and negatives per HPI.  Past Medical History:  Diagnosis Date  . Allergy    Claritin and Flonas helps manage symptoms  . History of abnormal cervical Pap smear    2009;2013 LGSIL - 10/13/14 neg  . History of mammogram 10/13/2014   neg  . History of Papanicolaou smear of cervix 10/13/2014   neg  . Migraine   . UTI (lower urinary tract infection)    Seen by urology. Macrobid prn    Past Surgical History:  Procedure Laterality Date  . COLPOSCOPY  12/02/2011  . INTRAUTERINE DEVICE (IUD) INSERTION    . SEPTOPLASTY  2000  Family History  Problem Relation Age of Onset  . Cancer Maternal Grandmother 47       colon cancer, breast cancer, brain cancer  . Hyperlipidemia Paternal Grandmother   . Hypertension Paternal Grandmother   . Heart disease Paternal Grandmother 85  . Cancer Paternal Grandmother 81       leukemia  . Hyperlipidemia Paternal Grandfather   . Stroke Paternal Grandfather   . Hypertension Paternal Grandfather   . Aneurysm Paternal Grandfather   . Heart disease Paternal Grandfather   . Hypothyroidism Father   . Hypothyroidism Sister   . Breast cancer Mother 73  . Heart disease Maternal Grandfather 65  . Cancer Other 70       colon    SOCIAL HX: non-smoker   Current Outpatient  Medications:  .  benzonatate (TESSALON) 100 MG capsule, Take 1-2 capsules (100-200 mg total) by mouth 3 (three) times daily as needed for cough., Disp: 40 capsule, Rfl: 0 .  fluticasone (FLONASE) 50 MCG/ACT nasal spray, Place 1 spray into both nostrils daily., Disp: 16 g, Rfl: 11 .  Loratadine 10 MG CAPS, Take 1 tablet by mouth daily., Disp: , Rfl:  .  Meth-Hyo-M Bl-Na Phos-Ph Sal (URIBEL) 118 MG CAPS, Take 1 capsule (118 mg total) by mouth daily as needed., Disp: 90 capsule, Rfl: 3 .  omeprazole (PRILOSEC) 40 MG capsule, Take 1 capsule (40 mg total) by mouth daily., Disp: 90 capsule, Rfl: 3 .  levonorgestrel (MIRENA) 20 MCG/24HR IUD, 1 Intra Uterine Device (1 each total) by Intrauterine route once for 1 dose., Disp: 1 each, Rfl: 0 .  nitrofurantoin (MACRODANTIN) 50 MG capsule, Take 1 capsule (50 mg total) by mouth as needed., Disp: 30 capsule, Rfl: 1 .  SUMAtriptan (IMITREX) 50 MG tablet, Take 1 tablet (50 mg total) by mouth once for 1 dose. May repeat in 2 hours if headache persists or recurs. No more than 2 doses in 24 hours., Disp: 10 tablet, Rfl: 0  EXAM:  VITALS per patient if applicable:  GENERAL: alert, oriented, appears well and in no acute distress  HEENT: atraumatic, conjunttiva clear, no obvious abnormalities on inspection of external nose and ears  NECK: normal movements of the head and neck  LUNGS: on inspection no signs of respiratory distress, breathing rate appears normal, no obvious gross SOB, gasping or wheezing  CV: no obvious cyanosis  MS: moves all visible extremities without noticeable abnormality  PSYCH/NEURO: pleasant and cooperative, no obvious depression or anxiety, speech and thought processing grossly intact  ASSESSMENT AND PLAN:  Discussed the following assessment and plan:  GERD (gastroesophageal reflux disease) Intermittent GERD with some mild dysphagia.  Will refer to GI.  She will continue omeprazole.  History of recurrent UTI (urinary tract  infection) Doing well with as needed Macrobid with intercourse.  She will monitor for any UTIs.  Hypertriglyceridemia Continue diet and exercise.  Migraines History of migraines that do not respond to over-the-counter medications.  We will trial Imitrex to see if that is beneficial.   Orders Placed This Encounter  Procedures  . Ambulatory referral to Gastroenterology    Referral Priority:   Routine    Referral Type:   Consultation    Referral Reason:   Specialty Services Required    Number of Visits Requested:   1    Meds ordered this encounter  Medications  . nitrofurantoin (MACRODANTIN) 50 MG capsule    Sig: Take 1 capsule (50 mg total) by mouth as needed.    Dispense:  30 capsule    Refill:  1  . SUMAtriptan (IMITREX) 50 MG tablet    Sig: Take 1 tablet (50 mg total) by mouth once for 1 dose. May repeat in 2 hours if headache persists or recurs. No more than 2 doses in 24 hours.    Dispense:  10 tablet    Refill:  0     I discussed the assessment and treatment plan with the patient. The patient was provided an opportunity to ask questions and all were answered. The patient agreed with the plan and demonstrated an understanding of the instructions.   The patient was advised to call back or seek an in-person evaluation if the symptoms worsen or if the condition fails to improve as anticipated.   [name], MD

## 2019-06-03 ENCOUNTER — Other Ambulatory Visit: Payer: Self-pay

## 2019-06-05 ENCOUNTER — Encounter: Payer: Self-pay | Admitting: Gastroenterology

## 2019-06-06 ENCOUNTER — Ambulatory Visit (INDEPENDENT_AMBULATORY_CARE_PROVIDER_SITE_OTHER): Payer: No Typology Code available for payment source | Admitting: Gastroenterology

## 2019-06-06 ENCOUNTER — Encounter: Payer: Self-pay | Admitting: Gastroenterology

## 2019-06-06 ENCOUNTER — Telehealth: Payer: Self-pay | Admitting: Gastroenterology

## 2019-06-06 DIAGNOSIS — K219 Gastro-esophageal reflux disease without esophagitis: Secondary | ICD-10-CM

## 2019-06-06 MED ORDER — OMEPRAZOLE 40 MG PO CPDR: 40.0000 mg | DELAYED_RELEASE_CAPSULE | Freq: Every day | ORAL | 0 refills | Status: DC

## 2019-06-06 NOTE — Progress Notes (Signed)
Susan Bentley  [place], [place] [address]  Main: [phone]  Fax: [phone]   Gastroenterology Consultation  Referring Provider:     Leone Haven, MD Primary Care Physician:  Leone Haven, MD Reason for Consultation:     GERD        HPI:   Virtual Visit via Video Note  I connected with patient on 06/06/19 at 10:00 AM EST by video ([email or web address]) and verified that I am speaking with the correct person using two identifiers.   I discussed the limitations, risks, security and privacy concerns of performing an evaluation and management service by video and the availability of in person appointments. I also discussed with the patient that there may be a patient responsible charge related to this service. The patient expressed understanding and agreed to proceed.  Location of the patient: Home Location of provider: Home Participating persons: Patient and provider only (Nursing staff checked in patient via phone but were not physically involved in the video interaction - see their notes)   History of Present Illness: Chief Complaint  Patient presents with  . New Patient (Initial Visit)  . Gastroesophageal Reflux    Patient states that  it has gotten worse in the last year. She has some burning in throat. Patient state she takes omeprazole 20mg  as needed     Susan Bentley is a 49 y.o. y/o female referred for consultation & management  by Dr. Caryl Bis, Angela Adam, MD.  Patient reports chronic history of reflux.  Has been using Prilosec as needed for years.  Does not use it continuously or daily.  Used it for a month years ago and symptoms got better and so has been using it as needed since then.  As the last 6 months symptoms have become more frequent.  Burning sensation in chest, especially after eating spicy foods.  When eats hot or cold foods, she feels that they slow down in her chest.  No episodes of food impaction.  Does not have dysphagia  with solid foods like meats or breads, just symptoms with hot or cold foods as described above.  No liquid dysphagia.  No prior EGD.  No weight loss.  No altered bowel habits.  No blood in stool.  Reports that she is up-to-date on her colonoscopy.  Had one in November 2020 with Dr. Lysle Pearl due to family history of colon cancer.  We do not have the report.  Past Medical History:  Diagnosis Date  . Allergy    Claritin and Flonas helps manage symptoms  . History of abnormal cervical Pap smear    2009;2013 LGSIL - 10/13/14 neg  . History of mammogram 10/13/2014   neg  . History of Papanicolaou smear of cervix 10/13/2014   neg  . Migraine   . UTI (lower urinary tract infection)    Seen by urology. Macrobid prn    Past Surgical History:  Procedure Laterality Date  . COLPOSCOPY  12/02/2011  . INTRAUTERINE DEVICE (IUD) INSERTION    . SEPTOPLASTY  2000    Prior to Admission medications   Medication Sig Start Date End Date Taking? Authorizing Provider  fluticasone (FLONASE) 50 MCG/ACT nasal spray Place 1 spray into both nostrils daily. 07/19/16  Yes Leone Haven, MD  levonorgestrel (MIRENA) 20 MCG/24HR IUD 1 Intra Uterine Device (1 each total) by Intrauterine route once for 1 dose. 02/20/17 [id number] Yes Copland, Elmo Putt B, PA-C  Loratadine 10 MG CAPS Take  1 tablet by mouth daily.   Yes [provider]  Meth-Hyo-M Bl-Na Phos-Ph Sal (URIBEL) 118 MG CAPS Take 1 capsule (118 mg total) by mouth daily as needed. 06/29/15  Yes Doss, Velora Heckler, RN  nitrofurantoin (MACRODANTIN) 50 MG capsule Take 1 capsule (50 mg total) by mouth as needed. 05/28/19  Yes Leone Haven, MD  omeprazole (PRILOSEC) 40 MG capsule Take 1 capsule (40 mg total) by mouth daily. 06/29/15  Yes Doss, Velora Heckler, RN  SUMAtriptan (IMITREX) 50 MG tablet Take 1 tablet (50 mg total) by mouth once for 1 dose. May repeat in 2 hours if headache persists or recurs. No more than 2 doses in 24 hours. 05/28/19 06/05/19 Yes Leone Haven,  MD    Family History  Problem Relation Age of Onset  . Cancer Maternal Grandmother 45       colon cancer, breast cancer, brain cancer  . Hyperlipidemia Paternal Grandmother   . Hypertension Paternal Grandmother   . Heart disease Paternal Grandmother 28  . Cancer Paternal Grandmother 52       leukemia  . Hyperlipidemia Paternal Grandfather   . Stroke Paternal Grandfather   . Hypertension Paternal Grandfather   . Aneurysm Paternal Grandfather   . Heart disease Paternal Grandfather   . Hypothyroidism Father   . Hypothyroidism Sister   . Breast cancer Mother 69  . Heart disease Maternal Grandfather 11  . Cancer Other 26       colon     Social History   Tobacco Use  . Smoking status: Never Smoker  . Smokeless tobacco: Never Used  Substance Use Topics  . Alcohol use: Yes    Alcohol/week: 0.0 standard drinks    Comment: occ  . Drug use: No    Allergies as of 06/06/2019 - Review Complete 06/06/2019  Allergen Reaction Noted  . Codeine Hives 03/29/2013  . Sulfa antibiotics Other (See Comments) 03/29/2013    Review of Systems:    All systems reviewed and negative except where noted in HPI.   Observations/Objective:  Labs: CBC    Component Value Date/Time   WBC 7.6 10/11/2018 0000   WBC 6.4 07/24/2015 0811   RBC 4.88 07/24/2015 0811   HGB 12.8 10/11/2018 0000   HCT 40 10/11/2018 0000   PLT 370 10/11/2018 0000   MCV 80.9 07/24/2015 0811   MCH 27.1 04/02/2013 0720   MCHC 33.5 07/24/2015 0811   RDW 14.2 07/24/2015 0811   RDW 14.3 04/02/2013 0720   LYMPHSABS 1.8 07/24/2015 0811   LYMPHSABS 1.7 04/02/2013 0720   MONOABS 0.4 07/24/2015 0811   EOSABS 0.1 07/24/2015 0811   EOSABS 0.1 04/02/2013 0720   BASOSABS 0.0 07/24/2015 0811   BASOSABS 0.0 04/02/2013 0720   CMP     Component Value Date/Time   NA 138 10/11/2018 0000   K 4.8 10/11/2018 0000   CL 105 07/24/2015 0811   CO2 28 07/24/2015 0811   GLUCOSE 96 07/24/2015 0811   BUN 11 10/11/2018 0000    CREATININE 0.8 10/11/2018 0000   CREATININE 0.82 07/24/2015 0811   CALCIUM 9.0 07/24/2015 0811   PROT 6.8 07/24/2015 0811   PROT 6.5 04/02/2013 0720   ALBUMIN 3.8 07/24/2015 0811   ALBUMIN 4.1 04/02/2013 0720   AST 20 07/19/2017 0000   ALT 23 07/19/2017 0000   ALKPHOS 87 07/19/2017 0000   BILITOT 0.4 07/24/2015 0811   GFRNONAA 103 04/02/2013 0720   GFRAA 119 04/02/2013 0720    Imaging Studies: No  results found.  Assessment and Plan:   Susan Bentley is a 49 y.o. y/o female has been referred for GERD  Assessment and Plan: Symptoms uncontrolled as patient is not on correct dosage at this time.  She is only taking it as needed.  We discussed correct use of the medication, 30 to 45 minutes before breakfast daily.  Using this for 3 to 4 weeks should help resolve her symptoms.  She does not have true dysphagia, just sensitivity to cold or hot foods which could happen with reflux or esophageal spasm.  We discussed options of conservative management with medications, versus proceeding with EGD given chronic history of reflux to rule out Barrett's and risks of benefits of this was discussed.  Patient would like to start with medication changes and if symptoms do not get better, consider procedure at that time  Patient educated extensively on acid reflux lifestyle modification, including buying a bed wedge, not eating 3 hrs before bedtime, diet modifications, and handout given for the same.    Follow Up Instructions: 4 to 6 weeks  I discussed the assessment and treatment plan with the patient. The patient was provided an opportunity to ask questions and all were answered. The patient agreed with the plan and demonstrated an understanding of the instructions.   The patient was advised to call back or seek an in-person evaluation if the symptoms worsen or if the condition fails to improve as anticipated.  I provided 15 minutes of face-to-face time via video software during this encounter.   Additional time was spent in reviewing patient's chart, placing orders etc.   Virgel Manifold, MD  Speech recognition software was used to dictate the above note.

## 2019-06-06 NOTE — Addendum Note (Signed)
Addended by: Ulyess Blossom L on: 06/06/2019 10:33 AM   Modules accepted: Orders

## 2019-06-06 NOTE — Telephone Encounter (Signed)
Left  vm to offer 6 week f/u with Dr. Bonna Gains

## 2019-06-06 NOTE — Patient Instructions (Signed)

## 2019-07-15 ENCOUNTER — Encounter: Payer: Self-pay | Admitting: Gastroenterology

## 2019-07-16 ENCOUNTER — Telehealth (INDEPENDENT_AMBULATORY_CARE_PROVIDER_SITE_OTHER): Payer: No Typology Code available for payment source | Admitting: Gastroenterology

## 2019-07-16 ENCOUNTER — Encounter: Payer: Self-pay | Admitting: Gastroenterology

## 2019-07-16 DIAGNOSIS — K219 Gastro-esophageal reflux disease without esophagitis: Secondary | ICD-10-CM | POA: Diagnosis not present

## 2019-07-16 LAB — HEPATIC FUNCTION PANEL
AST: 14 (ref 13–35)
Alkaline Phosphatase: 96 (ref 25–125)
Bilirubin, Total: 0.2

## 2019-07-16 LAB — CBC AND DIFFERENTIAL
HCT: 40 (ref 36–46)
Hemoglobin: 12.5 (ref 12.0–16.0)
Platelets: 403 — AB (ref 150–399)
WBC: 7.5

## 2019-07-16 LAB — HEMOGLOBIN A1C: Hemoglobin A1C: 5.9

## 2019-07-16 LAB — CBC: RBC: 4.72 (ref 3.87–5.11)

## 2019-07-16 LAB — COMPREHENSIVE METABOLIC PANEL WITH GFR
Albumin: 4 (ref 3.5–5.0)
Calcium: 9 (ref 8.7–10.7)
GFR calc Af Amer: 115
GFR calc non Af Amer: 100
Globulin: 2.3

## 2019-07-16 LAB — BASIC METABOLIC PANEL
BUN: 8 (ref 4–21)
Chloride: 103 (ref 99–108)
Creatinine: 0.7 (ref <=1.1)
Glucose: 105
Potassium: 4.4 (ref 3.4–5.3)
Sodium: 139 (ref 137–147)

## 2019-07-16 LAB — TSH: TSH: 2.89 (ref <=5.90)

## 2019-07-16 NOTE — Progress Notes (Signed)
Susan Antigua, MD [address]  [place]  [place], [place] [address]  Main: [phone]  Fax: [phone]   Primary Care Physician: Leone Haven, MD  Virtual Visit via Video Note  I connected with patient on 07/16/19 at 11:00 AM EDT by video (using [email or web address]) and verified that I am speaking with the correct person using two identifiers.   I discussed the limitations, risks, security and privacy concerns of performing an evaluation and management service by video and the availability of in person appointments. I also discussed with the patient that there may be a patient responsible charge related to this service. The patient expressed understanding and agreed to proceed.  Location of Patient: Home Location of Provider: Home Persons involved: Patient and provider only (Nursing staff checked in patient via phone but were not physically involved in the video interaction - see their notes)   History of Present Illness: Chief Complaint  Patient presents with  . Gastroesophageal Reflux    Patient is having no symptoms     HPI: Susan Bentley is a 49 y.o. female here for follow-up of reflux.  Proper PPI use was discussed at last visit and patient has been taking it daily 30 to 45 minutes before food.  No dysphagia. The patient denies abdominal or flank pain, anorexia, nausea or vomiting, dysphagia, change in bowel habits or black or bloody stools or weight loss. Reports significant improvement in symptoms and is happy with response to meds   Current Outpatient Medications  Medication Sig Dispense Refill  . fluticasone (FLONASE) 50 MCG/ACT nasal spray Place 1 spray into both nostrils daily. 16 g 11  . levonorgestrel (MIRENA) 20 MCG/24HR IUD 1 Intra Uterine Device (1 each total) by Intrauterine route once for 1 dose. 1 each 0  . Loratadine 10 MG CAPS Take 1 tablet by mouth daily.    . meloxicam (MOBIC) 15 MG tablet     . Meth-Hyo-M Bl-Na Phos-Ph Sal (URIBEL) 118 MG  CAPS Take 1 capsule (118 mg total) by mouth daily as needed. 90 capsule 3  . nitrofurantoin (MACRODANTIN) 50 MG capsule Take 1 capsule (50 mg total) by mouth as needed. 30 capsule 1  . omeprazole (PRILOSEC) 40 MG capsule Take 1 capsule (40 mg total) by mouth daily. 90 capsule 0  . SUMAtriptan (IMITREX) 50 MG tablet Take 1 tablet (50 mg total) by mouth once for 1 dose. May repeat in 2 hours if headache persists or recurs. No more than 2 doses in 24 hours. 10 tablet 0   No current facility-administered medications for this visit.    Allergies as of 07/16/2019 - Review Complete 07/15/2019  Allergen Reaction Noted  . Codeine Hives 03/29/2013  . Sulfa antibiotics Other (See Comments) 03/29/2013    Review of Systems:    All systems reviewed and negative except where noted in HPI.   Observations/Objective:  Labs: CMP     Component Value Date/Time   NA 138 10/11/2018 0000   K 4.8 10/11/2018 0000   CL 105 07/24/2015 0811   CO2 28 07/24/2015 0811   GLUCOSE 96 07/24/2015 0811   BUN 11 10/11/2018 0000   CREATININE 0.8 10/11/2018 0000   CREATININE 0.82 07/24/2015 0811   CALCIUM 9.0 07/24/2015 0811   PROT 6.8 07/24/2015 0811   PROT 6.5 04/02/2013 0720   ALBUMIN 3.8 07/24/2015 0811   ALBUMIN 4.1 04/02/2013 0720   AST 20 07/19/2017 0000   ALT 23 07/19/2017 0000   ALKPHOS 87 07/19/2017  0000   BILITOT 0.4 07/24/2015 0811   GFRNONAA 103 04/02/2013 0720   GFRAA 119 04/02/2013 0720   Lab Results  Component Value Date   WBC 7.6 10/11/2018   HGB 12.8 10/11/2018   HCT 40 10/11/2018   MCV 80.9 07/24/2015   PLT 370 10/11/2018    Imaging Studies: No results found.  Assessment and Plan:   Susan Bentley is a 49 y.o. y/o female here for follow-up of reflux  Assessment and Plan: Symptoms resolved with omeprazole Complete therapy after taking it for 2 months total and then discontinue  (Risks of PPI use were discussed with patient including bone loss, C. Diff diarrhea, pneumonia,  infections, CKD, electrolyte abnormalities.  Pt. Verbalizes understanding and chooses to continue the medication.)   Follow Up Instructions:    I discussed the assessment and treatment plan with the patient. The patient was provided an opportunity to ask questions and all were answered. The patient agreed with the plan and demonstrated an understanding of the instructions.   The patient was advised to call back or seek an in-person evaluation if the symptoms worsen or if the condition fails to improve as anticipated.  I provided 10 minutes of face-to-face time via video software during this encounter. Additional time was spent in reviewing patient's chart, placing orders etc.   Virgel Manifold, MD  Speech recognition software was used to dictate this note.

## 2019-07-22 ENCOUNTER — Encounter: Payer: Self-pay | Admitting: Family Medicine

## 2019-08-12 ENCOUNTER — Encounter: Payer: Self-pay | Admitting: Family Medicine

## 2019-08-12 DIAGNOSIS — Z1231 Encounter for screening mammogram for malignant neoplasm of breast: Secondary | ICD-10-CM

## 2019-08-12 DIAGNOSIS — Z1283 Encounter for screening for malignant neoplasm of skin: Secondary | ICD-10-CM

## 2019-09-20 ENCOUNTER — Ambulatory Visit
Admission: RE | Admit: 2019-09-20 | Discharge: 2019-09-20 | Disposition: A | Discharge status: Home / Self Care | Payer: No Typology Code available for payment source | Source: Ambulatory Visit | Attending: Family Medicine | Admitting: Family Medicine

## 2019-09-20 ENCOUNTER — Other Ambulatory Visit: Payer: Self-pay

## 2019-09-20 DIAGNOSIS — Z1231 Encounter for screening mammogram for malignant neoplasm of breast: Secondary | ICD-10-CM

## 2019-10-27 DIAGNOSIS — Z9189 Other specified personal risk factors, not elsewhere classified: Secondary | ICD-10-CM

## 2019-10-27 DIAGNOSIS — Z803 Family history of malignant neoplasm of breast: Secondary | ICD-10-CM

## 2019-10-27 DIAGNOSIS — Z1371 Encounter for nonprocreative screening for genetic disease carrier status: Secondary | ICD-10-CM

## 2019-10-27 HISTORY — DX: Encounter for nonprocreative screening for genetic disease carrier status: Z13.71

## 2019-10-27 HISTORY — DX: Other specified personal risk factors, not elsewhere classified: Z91.89

## 2019-10-27 HISTORY — DX: Family history of malignant neoplasm of breast: Z80.3

## 2019-11-07 NOTE — Progress Notes (Signed)
Chief Complaint  Patient presents with  . Gynecologic Exam    No complaints     HPI:      [name] is a 49 y.o. [id number] who LMP was No LMP recorded. (Menstrual status: IUD)., presents today for her annual examination.  Her menses are every few months with light bleeding for a couple days with IUD.  Dysmenorrhea none. She does not have occas intermenstrual bleeding. Has occas vasomotor sx.  Sex activity: single partner, contraception - IUD. Mirena placed 02/20/17. Has occas vaginal dryness, improved with lubricants. Last Pap: 08/30/16 Results were: no abnormalities /neg HPV DNA  Hx of STDs: HPV  Last mammogram: 09/20/19  Results were: normal--routine follow-up in 12 months. Done with PCP. There is a FH of breast cancer in her mom and possibly MGM (cancer was widespread--no primary site determined but thought to be breast or colon etiology, died age 80), genetic testing not done. There is no FH of ovarian cancer. The patient does do self-breast exams.  Tobacco use: The patient denies current or previous tobacco use. Alcohol use: none  No drug use Exercise: mod active  Colonoscopy: 2/21, repeat after 5 yrs due to [place] colon cancer   She does get adequate calcium but not Vitamin D in her diet. Labs with PCP.   Past Medical History:  Diagnosis Date  . Allergy    Claritin and Flonas helps manage symptoms  . History of abnormal cervical Pap smear    2009;2013 LGSIL - 10/13/14 neg  . History of mammogram 10/13/2014   neg  . History of Papanicolaou smear of cervix 10/13/2014   neg  . Migraine   . UTI (lower urinary tract infection)    Seen by urology. Macrobid prn    Past Surgical History:  Procedure Laterality Date  . COLPOSCOPY  12/02/2011  . INTRAUTERINE DEVICE (IUD) INSERTION    . SEPTOPLASTY  2000    Family History  Problem Relation Age of Onset  . Cancer Maternal Grandmother 78       colon cancer, breast cancer, brain cancer  . Hyperlipidemia Paternal  Grandmother   . Hypertension Paternal Grandmother   . Heart disease Paternal Grandmother 70  . Cancer Paternal Grandmother 71       leukemia  . Hyperlipidemia Paternal Grandfather   . Stroke Paternal Grandfather   . Hypertension Paternal Grandfather   . Aneurysm Paternal Grandfather   . Heart disease Paternal Grandfather   . Hypothyroidism Father   . Hypothyroidism Sister   . Breast cancer Mother 69  . Heart disease Maternal Grandfather 32  . Cancer Other 33       colon    Social History   Socioeconomic History  . Marital status: Married    Spouse name: Susan Bentley  . Number of children: 2  . Years of education: 35  . Highest education level: Not on file  Occupational History  . Occupation: Therapist, sports - Occupational Health    Comment: [employer]  Tobacco Use  . Smoking status: Never Smoker  . Smokeless tobacco: Never Used  Vaping Use  . Vaping Use: Never used  Substance and Sexual Activity  . Alcohol use: Yes    Alcohol/week: 0.0 standard drinks    Comment: occ  . Drug use: No  . Sexual activity: Yes    Birth control/protection: I.U.D., Other-see comments    Comment: mirena  Other Topics Concern  . Not on file  Social History Narrative   Susan Bentley grew up in  [place]. She lives at home with her husband, Susan Bentley, of 3 years. Her youngest son, Susan Bentley, is a Paramedic in [employer] and lives at home with them. Susan Bentley has another son, Susan Bentley, who is away at college at [employer]. Susan Bentley attended [employer] and obtained her Bachelors in Nursing. 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 She also enjoys gardening in the summer. She also quilts.   Social Determinants of Health   Financial Resource Strain:   . Difficulty of Paying Living Expenses:   Food Insecurity:   . Worried About Charity fundraiser in the Last Year:   . Arboriculturist in the Last Year:   Transportation Needs:   . Film/video editor (Medical):   Marland Kitchen Lack of Transportation  (Non-Medical):   Physical Activity:   . Days of Exercise per Week:   . Minutes of Exercise per Session:   Stress:   . Feeling of Stress :   Social Connections:   . Frequency of Communication with Friends and Family:   . Frequency of Social Gatherings with Friends and Family:   . Attends Religious Services:   . Active Member of Clubs or Organizations:   . Attends Archivist Meetings:   Marland Kitchen Marital Status:   Intimate Partner Violence:   . Fear of Current or Ex-Partner:   . Emotionally Abused:   Marland Kitchen Physically Abused:   . Sexually Abused:      Current Outpatient Medications:  .  fluticasone (FLONASE) 50 MCG/ACT nasal spray, Place 1 spray into both nostrils daily., Disp: 16 g, Rfl: 11 .  Loratadine 10 MG CAPS, Take 1 tablet by mouth daily., Disp: , Rfl:  .  Meth-Hyo-M Bl-Na Phos-Ph Sal (URIBEL) 118 MG CAPS, Take 1 capsule (118 mg total) by mouth daily as needed., Disp: 90 capsule, Rfl: 3 .  nitrofurantoin (MACRODANTIN) 50 MG capsule, Take 1 capsule (50 mg total) by mouth as needed., Disp: 30 capsule, Rfl: 1 .  levonorgestrel (MIRENA) 20 MCG/24HR IUD, 1 Intra Uterine Device (1 each total) by Intrauterine route once for 1 dose., Disp: 1 each, Rfl: 0 .  SUMAtriptan (IMITREX) 50 MG tablet, Take 1 tablet (50 mg total) by mouth once for 1 dose. May repeat in 2 hours if headache persists or recurs. No more than 2 doses in 24 hours., Disp: 10 tablet, Rfl: 0  ROS:  Review of Systems  Constitutional: Negative for fatigue, fever and unexpected weight change.  Respiratory: Negative for cough, shortness of breath and wheezing.   Cardiovascular: Negative for chest pain, palpitations and leg swelling.  Gastrointestinal: Negative for blood in stool, constipation, diarrhea, nausea and vomiting.  Endocrine: Negative for cold intolerance, heat intolerance and polyuria.  Genitourinary: Negative for dyspareunia, dysuria, flank pain, frequency, genital sores, hematuria, menstrual problem, pelvic  pain, urgency, vaginal bleeding, vaginal discharge and vaginal pain.  Musculoskeletal: Negative for back pain, joint swelling and myalgias.  Skin: Negative for rash.  Neurological: Negative for dizziness, syncope, light-headedness, numbness and headaches.  Hematological: Negative for adenopathy.  Psychiatric/Behavioral: Negative for agitation, confusion, sleep disturbance and suicidal ideas. The patient is not nervous/anxious.      Objective: BP 122/78 (BP Location: Left Arm, Patient Position: Sitting, Cuff Size: Normal)   Pulse 90   Ht '5\' 8"'$  (1.727 m)   Wt 260 lb (117.9 kg)   BMI 39.53 kg/m    Physical Exam Constitutional:      Appearance: She is well-developed.  Genitourinary:  Vulva, vagina, uterus, right adnexa and left adnexa normal.     No vulval lesion or tenderness noted.     No vaginal discharge, erythema or tenderness.     No cervical motion tenderness or polyp.     IUD strings visualized.     Uterus is not enlarged or tender.     No right or left adnexal mass present.     Right adnexa not tender.     Left adnexa not tender.  Neck:     Thyroid: No thyromegaly.  Cardiovascular:     Rate and Rhythm: Normal rate and regular rhythm.     Heart sounds: Normal heart sounds. No murmur heard.   Pulmonary:     Effort: Pulmonary effort is normal.     Breath sounds: Normal breath sounds.  Chest:     Breasts:        Right: No mass, nipple discharge, skin change or tenderness.        Left: No mass, nipple discharge, skin change or tenderness.  Abdominal:     Palpations: Abdomen is soft.     Tenderness: There is no abdominal tenderness. There is no guarding.  Musculoskeletal:        General: Normal range of motion.     Cervical back: Normal range of motion.  Neurological:     General: No focal deficit present.     Mental Status: She is alert and oriented to person, place, and time.     Cranial Nerves: No cranial nerve deficit.  Skin:    General: Skin is warm and  dry.  Psychiatric:        Mood and Affect: Mood normal.        Behavior: Behavior normal.        Thought Content: Thought content normal.        Judgment: Judgment normal.  Vitals reviewed.     Assessment/Plan: Encounter for annual routine gynecological examination  Encounter for routine checking of intrauterine contraceptive device (IUD); IUD strings in cx os. Due for removal 11/24 or sooner  Encounter for screening mammogram for malignant neoplasm of breast; pt current on mammo  Family history of breast cancer - Plan: Integrated [facility] ([place]); MyRisk testing discussed and done today. Don't know primary site of MGM cancer, but was aggressive and at young age. Will f/u with results.             GYN counsel mammography screening, adequate intake of calcium and vitamin D     F/U  Return in about 1 year (around 11/07/2020).  Laurann Mcmorris B. Nely Dedmon, PA-C 11/08/2019 9:23 AM

## 2019-11-08 ENCOUNTER — Other Ambulatory Visit: Payer: Self-pay

## 2019-11-08 ENCOUNTER — Encounter: Payer: Self-pay | Admitting: Obstetrics and Gynecology

## 2019-11-08 ENCOUNTER — Ambulatory Visit (INDEPENDENT_AMBULATORY_CARE_PROVIDER_SITE_OTHER): Payer: No Typology Code available for payment source | Admitting: Obstetrics and Gynecology

## 2019-11-08 VITALS — BP 122/78 | HR 90 | Ht 68.0 in | Wt 260.0 lb

## 2019-11-08 DIAGNOSIS — Z1231 Encounter for screening mammogram for malignant neoplasm of breast: Secondary | ICD-10-CM | POA: Diagnosis not present

## 2019-11-08 DIAGNOSIS — Z01419 Encounter for gynecological examination (general) (routine) without abnormal findings: Secondary | ICD-10-CM

## 2019-11-08 DIAGNOSIS — Z803 Family history of malignant neoplasm of breast: Secondary | ICD-10-CM

## 2019-11-08 DIAGNOSIS — Z30431 Encounter for routine checking of intrauterine contraceptive device: Secondary | ICD-10-CM | POA: Diagnosis not present

## 2019-11-08 NOTE — Patient Instructions (Signed)
I value your feedback and entrusting us with your care. If you get a [facility] patient survey, I would appreciate you taking the time to let us know about your experience today. Thank you!  As of March 07, 2019, your lab results will be released to your MyChart immediately, before I even have a chance to see them. Please give me time to review them and contact you if there are any abnormalities. Thank you for your patience.  

## 2019-11-27 ENCOUNTER — Other Ambulatory Visit: Payer: Self-pay

## 2019-11-27 ENCOUNTER — Telehealth: Payer: Self-pay | Admitting: Obstetrics and Gynecology

## 2019-11-27 ENCOUNTER — Ambulatory Visit (INDEPENDENT_AMBULATORY_CARE_PROVIDER_SITE_OTHER): Payer: No Typology Code available for payment source | Admitting: Family Medicine

## 2019-11-27 ENCOUNTER — Encounter: Payer: Self-pay | Admitting: Family Medicine

## 2019-11-27 VITALS — BP 115/70 | HR 77 | Temp 97.9°F | Ht 68.0 in | Wt 262.8 lb

## 2019-11-27 DIAGNOSIS — R7303 Prediabetes: Secondary | ICD-10-CM | POA: Insufficient documentation

## 2019-11-27 DIAGNOSIS — Z0000 Encounter for general adult medical examination without abnormal findings: Secondary | ICD-10-CM

## 2019-11-27 DIAGNOSIS — E790 Hyperuricemia without signs of inflammatory arthritis and tophaceous disease: Secondary | ICD-10-CM | POA: Diagnosis not present

## 2019-11-27 DIAGNOSIS — Z1159 Encounter for screening for other viral diseases: Secondary | ICD-10-CM | POA: Diagnosis not present

## 2019-11-27 NOTE — Patient Instructions (Signed)
Nice to see you. Please try to increase your walking for exercise. Please try to eat as healthfully as possible. We will request your colonoscopy records. Please get your flu vaccine as scheduled. Please check with your insurance company regarding coverage of Shingrix and if you want to get this here or at the pharmacy that is fine.

## 2019-11-27 NOTE — Assessment & Plan Note (Signed)
Encouraged diet and exercise.  

## 2019-11-27 NOTE — Assessment & Plan Note (Signed)
Physical exam completed. Encouraged healthy diet and exercise. We will request colonoscopy records. Encouraged her to get the flu vaccine. Encouraged Shingrix vaccine as well. She will check with her insurance. Hepatitis C screening completed with labs.

## 2019-11-27 NOTE — Assessment & Plan Note (Addendum)
No history of gout flare. Discussed continuing to monitor for any gout issues and continue to watch her uric acid. No need for medication at this time.

## 2019-11-27 NOTE — Progress Notes (Signed)
Susan Rumps, MD Phone: [phone]  Susan Bentley is a 49 y.o. female who presents today for CPE.  Diet: Grilled chicken and veggies.  Some beef.  Not much seafood.  Rare soda.  No sweet tea. Exercise: Walks 1 to 2 days a week Pap smear: 08/30/2016.  NILM and negative for HPV. Colonoscopy: Reports had this earlier this year. Mammogram: 09/20/2019. Family history-  Colon cancer: Father  Breast cancer: Mother  Ovarian cancer: Possibly in her grandmother.  Reports she had genetic testing through her gynecologist Menses: Irregular given that she has an IUD Vaccines-   Flu: Reports she will get this in a few weeks.  Tetanus: Up-to-date  Shingles: Due  COVID19: Up-to-date HIV screening: Up-to-date Hep C Screening: Due Tobacco use: No Alcohol use: No Illicit Drug use: No Dentist: Yes Ophthalmology: Yes A1c in the prediabetic range on lab work. Uric acid was also mildly elevated. She reports that is been elevated for some time. No history of gout flares.   Active Ambulatory Problems    Diagnosis Date Noted  . Routine general medical examination at a health care facility 03/29/2013  . GERD (gastroesophageal reflux disease) 03/29/2013  . History of recurrent UTI (urinary tract infection) 03/29/2013  . Headache 03/29/2013  . Hypertriglyceridemia 10/17/2018  . Elevated uric acid in blood 10/17/2018  . Family history of colon cancer in father 10/17/2018  . Migraines 05/28/2019  . Prediabetes 11/27/2019   Resolved Ambulatory Problems    Diagnosis Date Noted  . No Resolved Ambulatory Problems   Past Medical History:  Diagnosis Date  . Allergy   . History of abnormal cervical Pap smear   . History of mammogram 10/13/2014  . History of Papanicolaou smear of cervix 10/13/2014  . Migraine   . UTI (lower urinary tract infection)     Family History  Problem Relation Age of Onset  . Cancer Maternal Grandmother 72       colon cancer, breast cancer, brain cancer  .  Hyperlipidemia Paternal Grandmother   . Hypertension Paternal Grandmother   . Heart disease Paternal Grandmother 64  . Cancer Paternal Grandmother 20       leukemia  . Hyperlipidemia Paternal Grandfather   . Stroke Paternal Grandfather   . Hypertension Paternal Grandfather   . Aneurysm Paternal Grandfather   . Heart disease Paternal Grandfather   . Hypothyroidism Father   . Hypothyroidism Sister   . Breast cancer Mother 59  . Heart disease Maternal Grandfather 39  . Cancer Other 29       colon    Social History   Socioeconomic History  . Marital status: Married    Spouse name: Susan Bentley  . Number of children: 2  . Years of education: 39  . Highest education level: Not on file  Occupational History  . Occupation: Therapist, sports - Occupational Health    Comment: [employer]  Tobacco Use  . Smoking status: Never Smoker  . Smokeless tobacco: Never Used  Vaping Use  . Vaping Use: Never used  Substance and Sexual Activity  . Alcohol use: Yes    Alcohol/week: 0.0 standard drinks    Comment: occ  . Drug use: No  . Sexual activity: Yes    Birth control/protection: I.U.D., Other-see comments    Comment: mirena  Other Topics Concern  . Not on file  Social History Narrative   Susan Bentley grew up in [place]. She lives at home with her husband, Susan Bentley, of 3 years. Her youngest son, Susan Bentley, is a Paramedic  in [employer] and lives at home with them. Chizara has another son, Susan Bentley, who is away at college at [employer]. Susan Bentley attended [employer] and obtained her Bachelors in Nursing. 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 She also enjoys gardening in the summer. She also quilts.   Social Determinants of Health   Financial Resource Strain:   . Difficulty of Paying Living Expenses: Not on file  Food Insecurity:   . Worried About Charity fundraiser in the Last Year: Not on file  . Ran Out of Food in the Last Year: Not on file  Transportation Needs:   . Lack of  Transportation (Medical): Not on file  . Lack of Transportation (Non-Medical): Not on file  Physical Activity:   . Days of Exercise per Week: Not on file  . Minutes of Exercise per Session: Not on file  Stress:   . Feeling of Stress : Not on file  Social Connections:   . Frequency of Communication with Friends and Family: Not on file  . Frequency of Social Gatherings with Friends and Family: Not on file  . Attends Religious Services: Not on file  . Active Member of Clubs or Organizations: Not on file  . Attends Archivist Meetings: Not on file  . Marital Status: Not on file  Intimate Partner Violence:   . Fear of Current or Ex-Partner: Not on file  . Emotionally Abused: Not on file  . Physically Abused: Not on file  . Sexually Abused: Not on file    ROS  General:  Negative for nexplained weight loss, fever Skin: Negative for new or changing mole, sore that won't heal HEENT: Negative for trouble hearing, trouble seeing, ringing in ears, mouth sores, hoarseness, change in voice, dysphagia. CV:  Negative for chest pain, dyspnea, edema, palpitations Resp: Negative for cough, dyspnea, hemoptysis GI: Negative for nausea, vomiting, diarrhea, constipation, abdominal pain, melena, hematochezia. GU: Negative for dysuria, incontinence, urinary hesitance, hematuria, vaginal or penile discharge, polyuria, sexual difficulty, lumps in testicle or breasts MSK: Positive for joint pain, negative for muscle cramps or aches Neuro: Negative for headaches, weakness, numbness, dizziness, passing out/fainting Psych: Negative for depression, anxiety, memory problems  Objective  Physical Exam Vitals:   11/27/19 0857  BP: 115/70  Pulse: 77  Temp: 97.9 F (36.6 C)  SpO2: 99%    BP Readings from Last 3 Encounters:  11/27/19 115/70  11/08/19 122/78  10/17/18 120/70   Wt Readings from Last 3 Encounters:  11/27/19 262 lb 12.8 oz (119.2 kg)  11/08/19 260 lb (117.9 kg)  05/28/19 253 lb  (114.8 kg)    Physical Exam Constitutional:      General: She is not in acute distress.    Appearance: She is not diaphoretic.  Eyes:     Conjunctiva/sclera: Conjunctivae normal.     Pupils: Pupils are equal, round, and reactive to light.  Cardiovascular:     Rate and Rhythm: Normal rate and regular rhythm.     Heart sounds: Normal heart sounds.  Pulmonary:     Effort: Pulmonary effort is normal.     Breath sounds: Normal breath sounds.  Abdominal:     General: Bowel sounds are normal. There is no distension.     Palpations: Abdomen is soft.     Tenderness: There is no abdominal tenderness. There is no guarding or rebound.  Musculoskeletal:     Right lower leg: No edema.     Left  lower leg: No edema.  Lymphadenopathy:     Cervical: No cervical adenopathy.  Skin:    General: Skin is warm and dry.  Neurological:     Mental Status: She is alert.  Psychiatric:        Mood and Affect: Mood normal.      Assessment/Plan:   Routine general medical examination at a health care facility Physical exam completed. Encouraged healthy diet and exercise. We will request colonoscopy records. Encouraged her to get the flu vaccine. Encouraged Shingrix vaccine as well. She will check with her insurance. Hepatitis C screening completed with labs.  Prediabetes Encouraged diet and exercise.  Elevated uric acid in blood No history of gout flare. Discussed continuing to monitor for any gout issues and continue to watch her uric acid. No need for medication at this time.   Orders Placed This Encounter  Procedures  . CBC and differential    This external order was created through the Results Console.  . CBC    This external order was created through the Results Console.  . Basic metabolic panel    This external order was created through the Results Console.  . Comprehensive metabolic panel    This external order was created through the Results Console.  . Lipid panel    This external  order was created through the Results Console.  . Lipid panel    This external order was created through the Results Console.  . Hepatic function panel    This external order was created through the Results Console.  . Hemoglobin A1c    This external order was created through the Results Console.  . TSH    This external order was created through the Results Console.  . Hepatitis C Antibody    The 10-year ASCVD risk score Mikey Bussing DC Jr., et al., 2013) is: 2.2%*   Values used to calculate the score:     Age: 83 years     Sex: Female     Is Non-Hispanic African American: No     Diabetic: No     Tobacco smoker: No     Systolic Blood Pressure: 469 mmHg     Is BP treated: No     HDL Cholesterol: 33 mg/dL*     Total Cholesterol: 221 mg/dL*     * - Cholesterol units were assumed for this score calculation   No orders of the defined types were placed in this encounter.   This visit occurred during the SARS-CoV-2 public health emergency.  Safety protocols were in place, including screening questions prior to the visit, additional usage of staff PPE, and extensive cleaning of exam room while observing appropriate contact time as indicated for disinfecting solutions.    Susan Rumps, MD [place]

## 2019-11-27 NOTE — Telephone Encounter (Signed)
Pt aware of neg MyRisk testing except NBN VUS. IBIS=25%/riskscore=26.7%.  Patient understands these results only apply to her and her children, and this is not indicative of genetic testing results of her other family members. It is recommended that her other family members have genetic testing done.  Pt also understands negative genetic testing doesn't mean she will never get any of these cancers.   Hard copy mailed to pt. F/u prn.

## 2019-11-28 ENCOUNTER — Encounter: Payer: Self-pay | Admitting: Obstetrics and Gynecology

## 2019-11-28 DIAGNOSIS — Z9189 Other specified personal risk factors, not elsewhere classified: Secondary | ICD-10-CM

## 2019-11-28 DIAGNOSIS — Z1239 Encounter for other screening for malignant neoplasm of breast: Secondary | ICD-10-CM

## 2019-11-28 DIAGNOSIS — Z803 Family history of malignant neoplasm of breast: Secondary | ICD-10-CM

## 2019-11-28 LAB — HEPATITIS C ANTIBODY
Hepatitis C Ab: NONREACTIVE
SIGNAL TO CUT-OFF: 0.01 (ref <1.00)

## 2019-12-23 ENCOUNTER — Ambulatory Visit: Payer: No Typology Code available for payment source

## 2020-01-06 ENCOUNTER — Encounter: Payer: Self-pay | Admitting: Family Medicine

## 2020-02-25 ENCOUNTER — Other Ambulatory Visit: Payer: Self-pay

## 2020-02-25 ENCOUNTER — Ambulatory Visit
Admission: RE | Admit: 2020-02-25 | Discharge: 2020-02-25 | Disposition: A | Discharge status: Home / Self Care | Payer: No Typology Code available for payment source | Source: Ambulatory Visit | Attending: Obstetrics and Gynecology | Admitting: Obstetrics and Gynecology

## 2020-02-25 DIAGNOSIS — Z803 Family history of malignant neoplasm of breast: Secondary | ICD-10-CM

## 2020-02-25 DIAGNOSIS — Z1239 Encounter for other screening for malignant neoplasm of breast: Secondary | ICD-10-CM

## 2020-02-25 DIAGNOSIS — Z9189 Other specified personal risk factors, not elsewhere classified: Secondary | ICD-10-CM

## 2020-02-25 MED ORDER — GADOBUTROL 1 MMOL/ML IV SOLN
10.0000 mL | Freq: Once | INTRAVENOUS | Status: AC | PRN
  Administered 2020-02-25: 10 mL via INTRAVENOUS

## 2020-06-08 ENCOUNTER — Other Ambulatory Visit: Payer: Self-pay | Admitting: Family Medicine

## 2020-07-01 ENCOUNTER — Encounter: Payer: Self-pay | Admitting: Family Medicine

## 2020-07-01 DIAGNOSIS — L989 Disorder of the skin and subcutaneous tissue, unspecified: Secondary | ICD-10-CM

## 2020-07-09 ENCOUNTER — Encounter: Payer: Self-pay | Admitting: Family Medicine

## 2020-07-09 NOTE — Telephone Encounter (Signed)
Vertigo is not a common side effect that I have seen with shingles vaccine.  If persistent vertigo, needs to be evaluated.

## 2020-07-27 NOTE — Addendum Note (Signed)
Addended by: Caryl Bis Trevyn Lumpkin G on: 07/27/2020 12:44 PM   Modules accepted: Orders

## 2020-07-29 ENCOUNTER — Ambulatory Visit: Payer: No Typology Code available for payment source | Admitting: Family Medicine

## 2020-08-03 ENCOUNTER — Other Ambulatory Visit: Payer: Self-pay

## 2020-08-03 ENCOUNTER — Telehealth (INDEPENDENT_AMBULATORY_CARE_PROVIDER_SITE_OTHER): Payer: No Typology Code available for payment source | Admitting: Family Medicine

## 2020-08-03 ENCOUNTER — Encounter: Payer: Self-pay | Admitting: Family Medicine

## 2020-08-03 VITALS — Ht 68.0 in | Wt 265.0 lb

## 2020-08-03 DIAGNOSIS — U071 COVID-19: Secondary | ICD-10-CM | POA: Diagnosis not present

## 2020-08-03 MED ORDER — MOLNUPIRAVIR EUA 200MG CAPSULE
4.0000 | ORAL_CAPSULE | Freq: Two times a day (BID) | ORAL | 0 refills | Status: AC
  Filled 2020-08-03: qty 40, 5d supply, fill #0

## 2020-08-03 NOTE — Assessment & Plan Note (Signed)
The patient has mild to moderate symptoms of COVID-19 with a positive home test.  Her BMI places her at increased risk for progression to severe COVID-19.  I discussed oral therapy treatment options.  She has not had a kidney function test in about a year and thus we will proceed with molnupiravir.  This was sent to the [facility] employee pharmacy and the patient was advised to call when she arrived to pick it up to have the prescription brought out to her.  Discussed potential side effects of diarrhea, nausea, and dizziness.  She will monitor for any side effects and let us know if they occur.  She has an IUD in place for contraception.  Discussed continued supportive care with Mucinex, Tylenol, and pseudoephedrine as needed.  Encouraged to seek medical attention in the emergency department if she develops shortness of breath, cough productive of blood, elevated fevers, or chest pain.  Health at work will determine her quarantine duration.  I encourage strict quarantine precautions until she is released by health at work.

## 2020-08-03 NOTE — Progress Notes (Signed)
Virtual Visit via video Note  This visit type was conducted due to national recommendations for restrictions regarding the COVID-19 pandemic (e.g. social distancing).  This format is felt to be most appropriate for this patient at this time.  All issues noted in this document were discussed and addressed.  No physical exam was performed (except for noted visual exam findings with Video Visits).   I connected with Susan Bentley today at  3:15 PM EDT by a video enabled telemedicine application and verified that I am speaking with the correct person using two identifiers. Location patient: home Location provider: work Persons participating in the virtual visit: patient, provider  I discussed the limitations, risks, security and privacy concerns of performing an evaluation and management service by telephone and the availability of in person appointments. I also discussed with the patient that there may be a patient responsible charge related to this service. The patient expressed understanding and agreed to proceed.  Reason for visit: same day visit  HPI: COVID-19: Patient notes onset of symptoms 08/01/2020 with sneezing, cough, and head congestion.  She continues to have cough and congestion.  Felt as though she initially had some fever.  She also has had body aches and fatigue.  No shortness of breath.  No taste or smell disturbances.  Notes her son and husband have both tested positive for COVID.  She has received her vaccines and 1 booster shot.  She has been using Mucinex DM, Tylenol, and pseudoephedrine.   ROS: See pertinent positives and negatives per HPI.  Past Medical History:  Diagnosis Date  . Allergy    Claritin and Flonas helps manage symptoms  . BRCA negative 10/2019   MyRisk neg except NBN VUS  . Family history of breast cancer 10/2019  . History of abnormal cervical Pap smear    2009;2013 LGSIL - 10/13/14 neg  . History of mammogram 10/13/2014   neg  . History of  Papanicolaou smear of cervix 10/13/2014   neg  . Increased risk of breast cancer 10/2019   IBIS=25.0%/riskscore=26.7%  . Migraine   . UTI (lower urinary tract infection)    Seen by urology. Macrobid prn    Past Surgical History:  Procedure Laterality Date  . COLPOSCOPY  12/02/2011  . INTRAUTERINE DEVICE (IUD) INSERTION    . SEPTOPLASTY  2000    Family History  Problem Relation Age of Onset  . Cancer Maternal Grandmother 5       colon cancer, breast cancer, brain cancer  . Hyperlipidemia Paternal Grandmother   . Hypertension Paternal Grandmother   . Heart disease Paternal Grandmother 81  . Cancer Paternal Grandmother 73       leukemia  . Hyperlipidemia Paternal Grandfather   . Stroke Paternal Grandfather   . Hypertension Paternal Grandfather   . Aneurysm Paternal Grandfather   . Heart disease Paternal Grandfather   . Hypothyroidism Father   . Hypothyroidism Sister   . Breast cancer Mother 21  . Heart disease Maternal Grandfather 35  . Cancer Other 70       colon    SOCIAL HX: Non-smoker   Current Outpatient Medications:  .  amoxicillin-clavulanate (AUGMENTIN) 875-125 MG tablet, Take 1 tablet by mouth 2 (two) times daily., Disp: , Rfl:  .  cyclobenzaprine (FLEXERIL) 10 MG tablet, TAKE 1 TABLET BY MOUTH TWICE DAILY AS NEEDED, Disp: 30 tablet, Rfl: 0 .  fluticasone (FLONASE) 50 MCG/ACT nasal spray, Place 1 spray into both nostrils daily., Disp: 16 g, Rfl: 11 .  Loratadine 10 MG CAPS, Take 1 tablet by mouth daily., Disp: , Rfl:  .  Meth-Hyo-M Bl-Na Phos-Ph Sal (URIBEL) 118 MG CAPS, Take 1 capsule (118 mg total) by mouth daily as needed., Disp: 90 capsule, Rfl: 3 .  molnupiravir EUA 200 mg CAPS, Take 4 capsules (800 mg total) by mouth 2 (two) times daily for 5 days., Disp: 40 capsule, Rfl: 0 .  nitrofurantoin (MACRODANTIN) 50 MG capsule, Take 1 capsule (50 mg total) by mouth as needed., Disp: 30 capsule, Rfl: 1 .  levonorgestrel (MIRENA) 20 MCG/24HR IUD, 1 Intra Uterine  Device (1 each total) by Intrauterine route once for 1 dose., Disp: 1 each, Rfl: 0 .  SUMAtriptan (IMITREX) 50 MG tablet, Take 1 tablet (50 mg total) by mouth once for 1 dose. May repeat in 2 hours if headache persists or recurs. No more than 2 doses in 24 hours., Disp: 10 tablet, Rfl: 0  EXAM:  VITALS per patient if applicable:  GENERAL: alert, oriented, appears well and in no acute distress  HEENT: atraumatic, conjunttiva clear, no obvious abnormalities on inspection of external nose and ears  NECK: normal movements of the head and neck  LUNGS: on inspection no signs of respiratory distress, breathing rate appears normal, no obvious gross SOB, gasping or wheezing  CV: no obvious cyanosis  MS: moves all visible extremities without noticeable abnormality  PSYCH/NEURO: pleasant and cooperative, no obvious depression or anxiety, speech and thought processing grossly intact  ASSESSMENT AND PLAN:  Discussed the following assessment and plan:  Problem List Items Addressed This Visit    COVID-19 - Primary    The patient has mild to moderate symptoms of COVID-19 with a positive home test.  Her BMI places her at increased risk for progression to severe COVID-19.  I discussed oral therapy treatment options.  She has not had a kidney function test in about a year and thus we will proceed with molnupiravir.  This was sent to the [facility] employee pharmacy and the patient was advised to call when she arrived to pick it up to have the prescription brought out to her.  Discussed potential side effects of diarrhea, nausea, and dizziness.  She will monitor for any side effects and let us know if they occur.  She has an IUD in place for contraception.  Discussed continued supportive care with Mucinex, Tylenol, and pseudoephedrine as needed.  Encouraged to seek medical attention in the emergency department if she develops shortness of breath, cough productive of blood, elevated fevers, or chest  pain.  Health at work will determine her quarantine duration.  I encourage strict quarantine precautions until she is released by health at work.      Relevant Medications   molnupiravir EUA 200 mg CAPS      No follow-ups on file.   I discussed the assessment and treatment plan with the patient. The patient was provided an opportunity to ask questions and all were answered. The patient agreed with the plan and demonstrated an understanding of the instructions.   The patient was advised to call back or seek an in-person evaluation if the symptoms worsen or if the condition fails to improve as anticipated.   [name], MD

## 2020-08-06 ENCOUNTER — Encounter: Payer: Self-pay | Admitting: Family Medicine

## 2020-08-07 MED ORDER — BENZONATATE 200 MG PO CAPS: 200.0000 mg | ORAL_CAPSULE | Freq: Two times a day (BID) | ORAL | 0 refills | Status: DC | PRN

## 2020-08-26 ENCOUNTER — Encounter: Payer: Self-pay | Admitting: Family Medicine

## 2020-08-26 DIAGNOSIS — Z87440 Personal history of urinary (tract) infections: Secondary | ICD-10-CM

## 2020-08-27 ENCOUNTER — Other Ambulatory Visit: Payer: Self-pay | Admitting: Obstetrics and Gynecology

## 2020-08-27 ENCOUNTER — Encounter: Payer: Self-pay | Admitting: Obstetrics and Gynecology

## 2020-08-27 ENCOUNTER — Other Ambulatory Visit: Payer: Self-pay

## 2020-08-27 DIAGNOSIS — Z1231 Encounter for screening mammogram for malignant neoplasm of breast: Secondary | ICD-10-CM

## 2020-08-27 MED ORDER — NITROFURANTOIN MACROCRYSTAL 50 MG PO CAPS
50.0000 mg | ORAL_CAPSULE | ORAL | 1 refills | Status: DC | PRN
Start: 2020-08-27 — End: 2021-11-11
  Filled 2020-08-27: qty 30, 30d supply, fill #0
  Filled 2020-12-15: qty 30, 30d supply, fill #1

## 2020-09-18 ENCOUNTER — Telehealth: Payer: Self-pay | Admitting: Family Medicine

## 2020-09-18 NOTE — Telephone Encounter (Signed)
"  Rejection Reason - Patient did not respond - No response from patient...mb" Sammuel Hines said on Sep 18, 2020 12:40 PM  "Scheduling Information - 09/01/20- [facility] for patient to call back to schedule..mb" Sammuel Hines said on Sep 03, 2020 3:28 PM  Msg from [facility]

## 2020-09-20 NOTE — Telephone Encounter (Signed)
Please contact the patient and see if she still needs to see [facility] dermatology for the spot on her hand. Thanks.

## 2020-09-21 NOTE — Telephone Encounter (Signed)
I called and LVM for the patient to call back to ask if she still needed dermatology for her hands because they were trying to reach her to schedule.  Micai Apolinar,cma

## 2020-09-30 ENCOUNTER — Telehealth: Payer: No Typology Code available for payment source | Admitting: Obstetrics and Gynecology

## 2020-09-30 NOTE — Telephone Encounter (Signed)
Dr. [name] from a Peer-to Peer review group called re: MyRisk testing coverage by her insurance co from 9/21. Per my understanding, the insurance co didn't pay for the full panel and an appeal was requested. Dr. [name] wanted an explanation re: her meeting testing guidelines and why the panel was done. Discussed NCCN guidelines and that she most definitely meets criteria. With that said, often times [facility] won't collect full amount but won't balance bill pt.   Spoke with pt. She didn't initiate appeal and hasn't received a bill. So she is all good.  Nothing further to do.

## 2020-10-23 ENCOUNTER — Ambulatory Visit: Payer: No Typology Code available for payment source

## 2020-10-28 LAB — LIPID PANEL
Cholesterol: 218 — AB (ref 0–200)
Triglycerides: 194 — AB (ref 40–160)

## 2020-10-28 LAB — BASIC METABOLIC PANEL
BUN: 17 (ref 4–21)
Chloride: 100 (ref 99–108)
Creatinine: 0.8 (ref <=1.1)
Glucose: 102
Potassium: 4.5 (ref 3.4–5.3)
Sodium: 138 (ref 137–147)

## 2020-10-28 LAB — COMPREHENSIVE METABOLIC PANEL
Calcium: 9.3 (ref 8.7–10.7)
Globulin: 2.6

## 2020-10-28 LAB — CBC AND DIFFERENTIAL
HCT: 42 (ref 36–46)
Hemoglobin: 13.4 (ref 12.0–16.0)
WBC: 6

## 2020-10-28 LAB — HEPATIC FUNCTION PANEL
ALT: 24 (ref 7–35)
AST: 18 (ref 13–35)

## 2020-10-28 LAB — TSH: TSH: 2.27 (ref <=5.90)

## 2020-10-28 LAB — VITAMIN D 25 HYDROXY (VIT D DEFICIENCY, FRACTURES): Vit D, 25-Hydroxy: 67.8

## 2020-10-28 LAB — CBC: RBC: 5.07 (ref 3.87–5.11)

## 2020-10-28 LAB — HEMOGLOBIN A1C: Hemoglobin A1C: 6

## 2020-10-30 ENCOUNTER — Other Ambulatory Visit: Payer: Self-pay

## 2020-10-30 ENCOUNTER — Ambulatory Visit
Admission: RE | Admit: 2020-10-30 | Discharge: 2020-10-30 | Disposition: A | Discharge status: Home / Self Care | Payer: No Typology Code available for payment source | Source: Ambulatory Visit | Attending: Obstetrics and Gynecology | Admitting: Obstetrics and Gynecology

## 2020-10-30 DIAGNOSIS — Z1231 Encounter for screening mammogram for malignant neoplasm of breast: Secondary | ICD-10-CM

## 2020-11-09 NOTE — Progress Notes (Signed)
Chief Complaint  Patient presents with   Gynecologic Exam    No concerns    HPI:      Susan Bentley is a 50 y.o. [id number] who LMP was No LMP recorded. (Menstrual status: IUD)., presents today for her annual examination.  Her menses are every few months with light bleeding for a couple days with IUD.  Dysmenorrhea none. She does not have intermenstrual bleeding. Has occas vasomotor sx.  Sex activity: single partner, contraception - IUD. Mirena placed 02/20/17. Has occas vaginal dryness, improved with lubricants. Last Pap: 08/30/16 Results were: no abnormalities /neg HPV DNA  Hx of STDs: HPV  Last mammogram: 10/30/20 Results were: normal--routine follow-up in 12 months. Done with PCP. There is a FH of breast cancer in her mom and possibly MGM (cancer was widespread--no primary site determined but thought to be breast or colon etiology, died age 13). Pt is MyRisk neg except NBN VUS 8/21. IBIS=25.0%/riskscore=26.7%. There is no FH of ovarian cancer. The patient does do self-breast exams. Had neg breast MRI 02/25/20. Takes Vit D supp.  Tobacco use: The patient denies current or previous tobacco use. Alcohol use: none  No drug use Exercise: mod active  Colonoscopy: 2/21, repeat after 5 yrs due to [place] colon cancer   She does get adequate calcium and Vitamin D in her diet. Had normal levels on recent labs. Labs with PCP. Has pre-DM and borderline lipids, trying to lose wt.   Past Medical History:  Diagnosis Date   Allergy    Claritin and Flonas helps manage symptoms   BRCA negative 10/2019   MyRisk neg except NBN VUS   Family history of breast cancer 10/2019   History of abnormal cervical Pap smear    2009;2013 LGSIL - 10/13/14 neg   History of mammogram 10/13/2014   neg   History of Papanicolaou smear of cervix 10/13/2014   neg   Increased risk of breast cancer 10/2019   IBIS=25.0%/riskscore=26.7%   Migraine    UTI (lower urinary tract infection)    Seen by urology. Macrobid  prn    Past Surgical History:  Procedure Laterality Date   COLPOSCOPY  12/02/2011   INTRAUTERINE DEVICE (IUD) INSERTION     SEPTOPLASTY  2000    Family History  Problem Relation Age of Onset   Cancer Maternal Grandmother 48       colon cancer, breast cancer, brain cancer   Hyperlipidemia Paternal Grandmother    Hypertension Paternal Grandmother    Heart disease Paternal Grandmother 38   Cancer Paternal Grandmother 68       leukemia   Hyperlipidemia Paternal Grandfather    Stroke Paternal Grandfather    Hypertension Paternal Grandfather    Aneurysm Paternal Grandfather    Heart disease Paternal Grandfather    Hypothyroidism Father    Hypothyroidism Sister    Breast cancer Mother 72   Heart disease Maternal Grandfather 61   Cancer Other 32       colon    Social History   Socioeconomic History   Marital status: Married    Spouse name: Susan Bentley   Number of children: 2   Years of education: 16   Highest education level: Not on file  Occupational History   Occupation: Therapist, sports - Occupational Health    Comment: [name]  Tobacco Use   Smoking status: Never   Smokeless tobacco: Never  Vaping Use   Vaping Use: Never used  Substance and Sexual Activity   Alcohol use: Yes  Alcohol/week: 0.0 standard drinks    Comment: occ   Drug use: No   Sexual activity: Yes    Birth control/protection: I.U.D.    Comment: mirena  Other Topics Concern   Not on file  Social History Narrative   Susan Bentley grew up in [place]. She lives at home with her husband, Susan Bentley, of 3 years. Her youngest son, Susan Bentley, is a Paramedic in [employer] and lives at home with them. Susan Bentley has another son, Susan Bentley, who is away at college at [employer]. Susan Bentley attended [employer] and obtained her Bachelors in Nursing. 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 She also enjoys gardening in the summer. She also quilts.   Social Determinants of Health   Financial Resource Strain:  Not on file  Food Insecurity: Not on file  Transportation Needs: Not on file  Physical Activity: Not on file  Stress: Not on file  Social Connections: Not on file  Intimate Partner Violence: Not on file     Current Outpatient Medications:    cyclobenzaprine (FLEXERIL) 10 MG tablet, TAKE 1 TABLET BY MOUTH TWICE DAILY AS NEEDED, Disp: 30 tablet, Rfl: 0   fluticasone (FLONASE) 50 MCG/ACT nasal spray, Place 1 spray into both nostrils daily., Disp: 16 g, Rfl: 11   Loratadine 10 MG CAPS, Take 1 tablet by mouth daily., Disp: , Rfl:    Meth-Hyo-M Bl-Na Phos-Ph Sal (URIBEL) 118 MG CAPS, Take 1 capsule (118 mg total) by mouth daily as needed., Disp: 90 capsule, Rfl: 3   nitrofurantoin (MACRODANTIN) 50 MG capsule, Take 1 capsule (50 mg total) by mouth as needed., Disp: 30 capsule, Rfl: 1   levonorgestrel (MIRENA) 20 MCG/24HR IUD, 1 Intra Uterine Device (1 each total) by Intrauterine route once for 1 dose., Disp: 1 each, Rfl: 0   SUMAtriptan (IMITREX) 50 MG tablet, Take 1 tablet (50 mg total) by mouth once for 1 dose. May repeat in 2 hours if headache persists or recurs. No more than 2 doses in 24 hours., Disp: 10 tablet, Rfl: 0  ROS:  Review of Systems  Constitutional:  Negative for fatigue, fever and unexpected weight change.  Respiratory:  Negative for cough, shortness of breath and wheezing.   Cardiovascular:  Negative for chest pain, palpitations and leg swelling.  Gastrointestinal:  Negative for blood in stool, constipation, diarrhea, nausea and vomiting.  Endocrine: Negative for cold intolerance, heat intolerance and polyuria.  Genitourinary:  Negative for dyspareunia, dysuria, flank pain, frequency, genital sores, hematuria, menstrual problem, pelvic pain, urgency, vaginal bleeding, vaginal discharge and vaginal pain.  Musculoskeletal:  Negative for back pain, joint swelling and myalgias.  Skin:  Negative for rash.  Neurological:  Negative for dizziness, syncope, light-headedness, numbness  and headaches.  Hematological:  Negative for adenopathy.  Psychiatric/Behavioral:  Negative for agitation, confusion, sleep disturbance and suicidal ideas. The patient is not nervous/anxious.     Objective: BP 122/70   Ht _0  (1.727 m)   Wt 260 lb (117.9 kg)   BMI 39.53 kg/m    Physical Exam Constitutional:      Appearance: She is well-developed.  Genitourinary:     Vulva normal.     Right Labia: No rash, tenderness or lesions.    Left Labia: No tenderness, lesions or rash.    No vaginal discharge, erythema or tenderness.      Right Adnexa: not tender and no mass present.    Left Adnexa: not tender and no mass present.  No cervical motion tenderness, friability or polyp.     IUD strings visualized.     Uterus is not enlarged or tender.  Breasts:    Right: No mass, nipple discharge, skin change or tenderness.     Left: No mass, nipple discharge, skin change or tenderness.  Neck:     Thyroid: No thyromegaly.  Cardiovascular:     Rate and Rhythm: Normal rate and regular rhythm.     Heart sounds: Normal heart sounds. No murmur heard. Pulmonary:     Effort: Pulmonary effort is normal.     Breath sounds: Normal breath sounds.  Abdominal:     Palpations: Abdomen is soft.     Tenderness: There is no abdominal tenderness. There is no guarding or rebound.  Musculoskeletal:        General: Normal range of motion.     Cervical back: Normal range of motion.  Lymphadenopathy:     Cervical: No cervical adenopathy.  Neurological:     General: No focal deficit present.     Mental Status: She is alert and oriented to person, place, and time.     Cranial Nerves: No cranial nerve deficit.  Skin:    General: Skin is warm and dry.  Psychiatric:        Mood and Affect: Mood normal.        Behavior: Behavior normal.        Thought Content: Thought content normal.        Judgment: Judgment normal.  Vitals reviewed.    Assessment/Plan: Encounter for annual routine gynecological  examination  Cervical cancer screening - Plan: Cytology - PAP  Screening for HPV (human papillomavirus) - Plan: Cytology - PAP  Encounter for screening mammogram for malignant neoplasm of breast--pt current on mammo.  Family history of breast cancer - Plan: Integrated BRACAnalysis ([place]); MyRisk testing neg  Increased risk of breast cancer--pt aware of monthly SBE, yearly CBE and mammos. Did scr breast MRI last yr, will call by 12/22 if would like again by 2/23. May want to go someplace other than [facility] due to bad experience.             GYN counsel mammography screening, adequate intake of calcium and vitamin D     F/U  Return in about 1 year (around 11/10/2021).  Fabiana Dromgoole B. Mckennah Kretchmer, PA-C 11/10/2020 9:41 AM

## 2020-11-10 ENCOUNTER — Encounter: Payer: Self-pay | Admitting: Obstetrics and Gynecology

## 2020-11-10 ENCOUNTER — Ambulatory Visit (INDEPENDENT_AMBULATORY_CARE_PROVIDER_SITE_OTHER): Payer: No Typology Code available for payment source | Admitting: Obstetrics and Gynecology

## 2020-11-10 ENCOUNTER — Other Ambulatory Visit (HOSPITAL_COMMUNITY)
Admission: RE | Admit: 2020-11-10 | Discharge: 2020-11-10 | Disposition: A | Discharge status: Home / Self Care | Payer: No Typology Code available for payment source | Source: Ambulatory Visit | Attending: Obstetrics and Gynecology | Admitting: Obstetrics and Gynecology

## 2020-11-10 ENCOUNTER — Other Ambulatory Visit: Payer: Self-pay

## 2020-11-10 VITALS — BP 122/70 | Ht 68.0 in | Wt 260.0 lb

## 2020-11-10 DIAGNOSIS — Z1151 Encounter for screening for human papillomavirus (HPV): Secondary | ICD-10-CM | POA: Diagnosis not present

## 2020-11-10 DIAGNOSIS — Z1231 Encounter for screening mammogram for malignant neoplasm of breast: Secondary | ICD-10-CM | POA: Diagnosis not present

## 2020-11-10 DIAGNOSIS — Z124 Encounter for screening for malignant neoplasm of cervix: Secondary | ICD-10-CM | POA: Insufficient documentation

## 2020-11-10 DIAGNOSIS — Z01419 Encounter for gynecological examination (general) (routine) without abnormal findings: Secondary | ICD-10-CM

## 2020-11-10 DIAGNOSIS — Z9189 Other specified personal risk factors, not elsewhere classified: Secondary | ICD-10-CM

## 2020-11-10 DIAGNOSIS — Z803 Family history of malignant neoplasm of breast: Secondary | ICD-10-CM

## 2020-11-10 NOTE — Patient Instructions (Signed)
I value your feedback and you entrusting us with your care. If you get a [facility] patient survey, I would appreciate you taking the time to let us know about your experience today. Thank you! ? ? ?

## 2020-11-12 LAB — CYTOLOGY - PAP
Comment: NEGATIVE
Diagnosis: NEGATIVE
High risk HPV: NEGATIVE

## 2020-11-13 ENCOUNTER — Ambulatory Visit: Payer: Self-pay

## 2020-11-13 ENCOUNTER — Other Ambulatory Visit: Payer: Self-pay

## 2020-11-13 ENCOUNTER — Ambulatory Visit: Payer: No Typology Code available for payment source | Attending: Internal Medicine

## 2020-11-13 DIAGNOSIS — Z23 Encounter for immunization: Secondary | ICD-10-CM

## 2020-11-13 MED ORDER — PFIZER-BIONT COVID-19 VAC-TRIS 30 MCG/0.3ML IM SUSP
INTRAMUSCULAR | 0 refills | Status: DC
  Filled 2020-11-13: qty 0.3, 1d supply, fill #0

## 2020-11-13 NOTE — Progress Notes (Signed)
   Covid-19 Vaccination Clinic  Name:  Susan Bentley    MRN: [id number] DOB: Feb 27, 1971  11/13/2020  [name] was observed post Covid-19 immunization for 15 minutes without incident. She was provided with Vaccine Information Sheet and instruction to access the V-Safe system.   [name] was instructed to call 911 with any severe reactions post vaccine: Difficulty breathing  Swelling of face and throat  A fast heartbeat  A bad rash all over body  Dizziness and weakness   Immunizations Administered     Name Date Dose VIS Date Route   PFIZER Comrnaty(Gray TOP) Covid-19 Vaccine 11/13/2020 11:56 AM 0.3 mL 03/05/2020 Intramuscular   Manufacturer: [employer]   Lot: [id number]   NDC: [place], PharmD, MBA Clinical Acute Care Pharmacist

## 2020-11-23 ENCOUNTER — Other Ambulatory Visit: Payer: Self-pay

## 2020-11-23 MED ORDER — NITROFURANTOIN MONOHYD MACRO 100 MG PO CAPS
ORAL_CAPSULE | ORAL | 0 refills | Status: DC
  Filled 2020-11-23: qty 14, 7d supply, fill #0

## 2020-11-26 LAB — IRON,TIBC AND FERRITIN PANEL: Iron: 39

## 2020-11-26 LAB — LIPID PANEL
Cholesterol: 221 — AB (ref 0–200)
HDL: 300 — AB (ref 35–70)
LDL Cholesterol: 151
Triglycerides: 200 — AB (ref 40–160)

## 2020-11-26 LAB — CBC AND DIFFERENTIAL
HCT: 40 (ref 36–46)
Hemoglobin: 12.5 (ref 12.0–16.0)
Neutrophils Absolute: 5.2
Platelets: 403 — AB (ref 150–399)
WBC: 7.5

## 2020-11-26 LAB — BASIC METABOLIC PANEL
BUN: 8 (ref 4–21)
Chloride: 103 (ref 99–108)
Creatinine: 0.7 (ref 0.5–1.1)
Glucose: 105
Potassium: 4.4 (ref 3.4–5.3)
Sodium: 139 (ref 137–147)

## 2020-11-26 LAB — HEPATIC FUNCTION PANEL
ALT: 14 (ref 7–35)
AST: 14 (ref 13–35)
Alkaline Phosphatase: 96 (ref 25–125)
Bilirubin, Total: 0.2

## 2020-11-26 LAB — HEMOGLOBIN A1C: Hemoglobin A1C: 5.9

## 2020-11-26 LAB — COMPREHENSIVE METABOLIC PANEL
Albumin: 4 (ref 3.5–5.0)
Calcium: 9 (ref 8.7–10.7)
GFR calc Af Amer: 115
GFR calc non Af Amer: 100
Globulin: 2.3

## 2020-11-26 LAB — CBC: RBC: 4.72 (ref 3.87–5.11)

## 2020-11-26 LAB — VITAMIN D 25 HYDROXY (VIT D DEFICIENCY, FRACTURES): Vit D, 25-Hydroxy: 36.6

## 2020-11-26 LAB — TSH: TSH: 2.89 (ref 0.41–5.90)

## 2020-11-27 ENCOUNTER — Ambulatory Visit (INDEPENDENT_AMBULATORY_CARE_PROVIDER_SITE_OTHER): Payer: No Typology Code available for payment source | Admitting: Family Medicine

## 2020-11-27 ENCOUNTER — Other Ambulatory Visit: Payer: Self-pay

## 2020-11-27 ENCOUNTER — Encounter: Payer: Self-pay | Admitting: Family Medicine

## 2020-11-27 DIAGNOSIS — Z0001 Encounter for general adult medical examination with abnormal findings: Secondary | ICD-10-CM | POA: Diagnosis not present

## 2020-11-27 DIAGNOSIS — M25562 Pain in left knee: Secondary | ICD-10-CM

## 2020-11-27 DIAGNOSIS — M2550 Pain in unspecified joint: Secondary | ICD-10-CM | POA: Insufficient documentation

## 2020-11-27 DIAGNOSIS — M25561 Pain in right knee: Secondary | ICD-10-CM | POA: Diagnosis not present

## 2020-11-27 NOTE — Assessment & Plan Note (Signed)
Likely related to post-COVID inflammation.  Discussed occasional use of Aleve.  I suspect exercise may help with this.  If its not improving over time she will let us know.

## 2020-11-27 NOTE — Assessment & Plan Note (Signed)
Physical exam completed.  I encouraged her to add in exercise 2 days a week to start with and then work her way up from there.  I encouraged continued healthy diet.  Discussed weight loss would likely occur after starting with exercise.  I did encourage her to have the MRI of her breast though she will continue to discuss this with gynecology.  We will request her colonoscopy records.  Discussed that she did not require medication for the asymptomatic candidal colonization.  She will get her flu shot through work.  Lab work has been reviewed.  Discussed diet and exercise changes for her prediabetes.  We will see her back in 6 months to review that and repeat lab work.  We will await her paper copy of her labs to be scanned in as the patient had a question about her uric acid and I am unable to review that today.

## 2020-11-27 NOTE — Patient Instructions (Signed)
Nice to see you. Please try to add in 2 to 3 days of exercise.  Please work your way up from there. Please consider getting the MRI of your breast on a yearly basis. Please get your flu vaccine through work. Please monitor your knee and ankle discomfort.  If it does not start to improve over the next several weeks to a month or 2 please let us know.

## 2020-11-27 NOTE — Progress Notes (Signed)
[name], MD Phone: [phone]  Susan Bentley is a 50 y.o. female who presents today for CPE.  Diet: has cut down on portions, carbs, and sugars Exercise: no Pap smear: 11/10/20 neg HPV, NILM, candida noted Colonoscopy: reports she had one in November 2021 Mammogram: 10/30/20, negative, had genetic testing previously, found to be at increased risk, she is considering yearly MRI, she had one in 2021, following with GYN for this Family history-  Colon cancer: father  Breast cancer: mother  Ovarian cancer: no Menses: every 4 months of so, has discussed with GYN Vaccines-   Flu: defers to later in the season  Tetanus: UTD  Shingles: UTD, at [place]: UTD HIV screening: UTD Hep C Screening: UTD Tobacco use: no Alcohol use: rare Illicit Drug use: no Dentist: yes Ophthalmology: yes  Knee and ankle pain: Patient notes this is sporadic.  Occurs about once a week.  She has discomfort.  Notes this seemed to start after having had COVID.  Aleve is beneficial.   Active Ambulatory Problems    Diagnosis Date Noted   Encounter for general adult medical examination with abnormal findings 03/29/2013   GERD (gastroesophageal reflux disease) 03/29/2013   History of recurrent UTI (urinary tract infection) 03/29/2013   Headache 03/29/2013   Hypertriglyceridemia 10/17/2018   Elevated uric acid in blood 10/17/2018   Family history of colon cancer in father 10/17/2018   Migraines 05/28/2019   Prediabetes 11/27/2019   COVID-19 08/03/2020   Family history of breast cancer 11/10/2020   Increased risk of breast cancer 11/10/2020   Arthralgia 11/27/2020   Resolved Ambulatory Problems    Diagnosis Date Noted   No Resolved Ambulatory Problems   Past Medical History:  Diagnosis Date   Allergy    BRCA negative 10/2019   History of abnormal cervical Pap smear    History of mammogram 10/13/2014   History of Papanicolaou smear of cervix 10/13/2014   Migraine    UTI (lower urinary  tract infection)     Family History  Problem Relation Age of Onset   Cancer Maternal Grandmother 36       colon cancer, breast cancer, brain cancer   Hyperlipidemia Paternal Grandmother    Hypertension Paternal Grandmother    Heart disease Paternal Grandmother 41   Cancer Paternal Grandmother 52       leukemia   Hyperlipidemia Paternal Grandfather    Stroke Paternal Grandfather    Hypertension Paternal Grandfather    Aneurysm Paternal Grandfather    Heart disease Paternal Grandfather    Hypothyroidism Father    Hypothyroidism Sister    Breast cancer Mother 93   Heart disease Maternal Grandfather 67   Cancer Other 75       colon    Social History   Socioeconomic History   Marital status: Married    Spouse name: [name]   Number of children: 2   Years of education: 16   Highest education level: Not on file  Occupational History   Occupation: Therapist, sports - Occupational Health    Comment: [name]  Tobacco Use   Smoking status: Never   Smokeless tobacco: Never  Vaping Use   Vaping Use: Never used  Substance and Sexual Activity   Alcohol use: Yes    Alcohol/week: 0.0 standard drinks    Comment: occ   Drug use: No   Sexual activity: Yes    Birth control/protection: I.U.D.    Comment: mirena  Other Topics Concern   Not on file  Social History Narrative   Thomasene grew up in [place]. She lives at home with her husband, [name], of 3 years. Her youngest son, [name], is a Paramedic in [employer] and lives at home with them. Emilyann has another son, [name], who is away at college at [employer]. Dorinda attended [employer] and obtained her Bachelors in Nursing. 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 She also enjoys gardening in the summer. She also quilts.   Social Determinants of Health   Financial Resource Strain: Not on file  Food Insecurity: Not on file  Transportation Needs: Not on file  Physical Activity: Not on file  Stress: Not on  file  Social Connections: Not on file  Intimate Partner Violence: Not on file    ROS  General:  Negative for nexplained weight loss, fever Skin: Negative for new or changing mole, sore that won't heal HEENT: Negative for trouble hearing, trouble seeing, ringing in ears, mouth sores, hoarseness, change in voice, dysphagia. CV:  Negative for chest pain, dyspnea, edema, palpitations Resp: Negative for cough, dyspnea, hemoptysis GI: Negative for nausea, vomiting, diarrhea, constipation, abdominal pain, melena, hematochezia. GU: Negative for dysuria, incontinence, urinary hesitance, hematuria, vaginal or penile discharge, polyuria, sexual difficulty, lumps in testicle or breasts MSK: Positive for joint pain, negative for muscle cramps or aches, joint swelling Neuro: Negative for headaches, weakness, numbness, dizziness, passing out/fainting Psych: Negative for depression, anxiety, memory problems  Objective  Physical Exam Vitals:   11/27/20 0933  BP: 122/78  Pulse: 86  Temp: 98 F (36.7 C)  SpO2: 98%    BP Readings from Last 3 Encounters:  11/27/20 122/78  11/10/20 122/70  11/27/19 115/70   Wt Readings from Last 3 Encounters:  11/27/20 258 lb (117 kg)  11/10/20 260 lb (117.9 kg)  08/03/20 265 lb (120.2 kg)    Physical Exam Constitutional:      General: She is not in acute distress.    Appearance: She is not diaphoretic.  HENT:     Head: Normocephalic and atraumatic.  Eyes:     Conjunctiva/sclera: Conjunctivae normal.     Pupils: Pupils are equal, round, and reactive to light.  Cardiovascular:     Rate and Rhythm: Normal rate and regular rhythm.     Heart sounds: Normal heart sounds.  Pulmonary:     Effort: Pulmonary effort is normal.     Breath sounds: Normal breath sounds.  Abdominal:     General: Bowel sounds are normal. There is no distension.     Palpations: Abdomen is soft.     Tenderness: There is no abdominal tenderness. There is no guarding or rebound.   Musculoskeletal:     Right lower leg: No edema.     Left lower leg: No edema.     Comments: No knee or ankle tenderness or swelling  Lymphadenopathy:     Cervical: No cervical adenopathy.  Skin:    General: Skin is warm and dry.  Neurological:     Mental Status: She is alert.  Psychiatric:        Mood and Affect: Mood normal.     Assessment/Plan:   Problem List Items Addressed This Visit     Arthralgia    Likely related to post-COVID inflammation.  Discussed occasional use of Aleve.  I suspect exercise may help with this.  If its not improving over time she will let us know.      Encounter for general adult medical  examination with abnormal findings    Physical exam completed.  I encouraged her to add in exercise 2 days a week to start with and then work her way up from there.  I encouraged continued healthy diet.  Discussed weight loss would likely occur after starting with exercise.  I did encourage her to have the MRI of her breast though she will continue to discuss this with gynecology.  We will request her colonoscopy records.  Discussed that she did not require medication for the asymptomatic candidal colonization.  She will get her flu shot through work.  Lab work has been reviewed.  Discussed diet and exercise changes for her prediabetes.  We will see her back in 6 months to review that and repeat lab work.  We will await her paper copy of her labs to be scanned in as the patient had a question about her uric acid and I am unable to review that today.       Return in about 6 months (around 05/27/2021) for Prediabetes.  This visit occurred during the SARS-CoV-2 public health emergency.  Safety protocols were in place, including screening questions prior to the visit, additional usage of staff PPE, and extensive cleaning of exam room while observing appropriate contact time as indicated for disinfecting solutions.    [name], MD [place]

## 2020-12-01 ENCOUNTER — Other Ambulatory Visit: Payer: Self-pay

## 2020-12-01 MED ORDER — FLUCONAZOLE 150 MG PO TABS
150.0000 mg | ORAL_TABLET | ORAL | 1 refills | Status: DC
  Filled 2020-12-01: qty 2, 4d supply, fill #0
  Filled 2021-01-29: qty 2, 4d supply, fill #1

## 2020-12-04 ENCOUNTER — Encounter: Payer: Self-pay | Admitting: Family Medicine

## 2020-12-10 ENCOUNTER — Telehealth: Payer: Self-pay

## 2020-12-10 ENCOUNTER — Telehealth: Payer: Self-pay | Admitting: Family Medicine

## 2020-12-10 NOTE — Telephone Encounter (Signed)
Please let the patient know that I have reviewed her labs.  Her uric acid is slightly elevated.  Does she have a history of gout flares?  Her A1c is in the prediabetic range.  She needs to work on diet and exercise for that.  Her LDL cholesterol is elevated though she does not meet criteria for medication.  She needs to work on diet and exercise changes for that.  Her other lab work is acceptable.

## 2020-12-10 NOTE — Telephone Encounter (Signed)
I called and spoke with the patient and informed her of her lab results and she understood.  Patient stated she does not have any gout flares that she knows of but her uric acid has always been elevated.   Arcenio Mullaly,cma

## 2020-12-14 NOTE — Telephone Encounter (Signed)
Noted.  The elevated uric acid certainly indicates that she is at risk for developing a gout flare.  Given that she has never had a flare before I would not put her on any medication for this.  We can continue to monitor her uric acid levels.

## 2020-12-15 ENCOUNTER — Other Ambulatory Visit: Payer: Self-pay

## 2020-12-15 NOTE — Telephone Encounter (Signed)
Please let the patient know that her colonoscopy is due on 12/30/2020.  If she is okay with this I can go ahead and place a referral.

## 2020-12-17 NOTE — Telephone Encounter (Signed)
Noted.  Please abstract this and let the patient know that she will be due for a colonoscopy in 2025.

## 2020-12-17 NOTE — Telephone Encounter (Signed)
I called to [facility] and she did have a colonoscopy in 2020 and they are faxing the report to me.  Lakeem Rozo,cma

## 2020-12-18 NOTE — Telephone Encounter (Signed)
I called and informed the patient that her colonoscopy is not due until 2025 and she understood.  Talmage Teaster,cma

## 2021-01-22 ENCOUNTER — Other Ambulatory Visit: Payer: Self-pay

## 2021-01-22 MED ORDER — MELOXICAM 15 MG PO TABS
ORAL_TABLET | ORAL | 4 refills | Status: DC
  Filled 2021-01-22: qty 30, 30d supply, fill #0

## 2021-01-29 ENCOUNTER — Other Ambulatory Visit: Payer: Self-pay

## 2021-02-10 ENCOUNTER — Encounter: Payer: Self-pay | Admitting: Obstetrics and Gynecology

## 2021-02-10 DIAGNOSIS — Z803 Family history of malignant neoplasm of breast: Secondary | ICD-10-CM

## 2021-02-10 DIAGNOSIS — Z1239 Encounter for other screening for malignant neoplasm of breast: Secondary | ICD-10-CM

## 2021-02-10 DIAGNOSIS — Z9189 Other specified personal risk factors, not elsewhere classified: Secondary | ICD-10-CM

## 2021-02-15 ENCOUNTER — Encounter: Payer: Self-pay | Admitting: Family Medicine

## 2021-02-15 ENCOUNTER — Other Ambulatory Visit: Payer: Self-pay

## 2021-02-15 MED ORDER — OSELTAMIVIR PHOSPHATE 75 MG PO CAPS
75.0000 mg | ORAL_CAPSULE | ORAL | 0 refills | Status: DC
  Filled 2021-02-15: qty 10, 10d supply, fill #0

## 2021-04-28 ENCOUNTER — Encounter: Payer: Self-pay | Admitting: Family Medicine

## 2021-04-28 DIAGNOSIS — M25512 Pain in left shoulder: Secondary | ICD-10-CM

## 2021-05-03 ENCOUNTER — Other Ambulatory Visit: Payer: Self-pay

## 2021-05-03 MED ORDER — MELOXICAM 15 MG PO TABS
ORAL_TABLET | ORAL | 2 refills | Status: DC
  Filled 2021-05-03: qty 30, 30d supply, fill #0
  Filled 2021-05-28: qty 30, 30d supply, fill #1
  Filled 2021-10-12: qty 30, 30d supply, fill #2

## 2021-05-06 ENCOUNTER — Encounter: Payer: Self-pay | Admitting: Obstetrics and Gynecology

## 2021-05-07 ENCOUNTER — Other Ambulatory Visit: Payer: Self-pay

## 2021-05-07 ENCOUNTER — Ambulatory Visit
Admission: RE | Admit: 2021-05-07 | Discharge: 2021-05-07 | Disposition: A | Discharge status: Home / Self Care | Payer: No Typology Code available for payment source | Source: Ambulatory Visit | Attending: Obstetrics and Gynecology | Admitting: Obstetrics and Gynecology

## 2021-05-07 DIAGNOSIS — Z9189 Other specified personal risk factors, not elsewhere classified: Secondary | ICD-10-CM | POA: Insufficient documentation

## 2021-05-07 DIAGNOSIS — Z803 Family history of malignant neoplasm of breast: Secondary | ICD-10-CM | POA: Diagnosis present

## 2021-05-07 DIAGNOSIS — Z1239 Encounter for other screening for malignant neoplasm of breast: Secondary | ICD-10-CM | POA: Diagnosis not present

## 2021-05-07 MED ORDER — GADOBUTROL 1 MMOL/ML IV SOLN
10.0000 mL | Freq: Once | INTRAVENOUS | Status: AC | PRN
  Administered 2021-05-07: 10 mL via INTRAVENOUS

## 2021-05-08 ENCOUNTER — Other Ambulatory Visit: Payer: Self-pay | Admitting: Obstetrics and Gynecology

## 2021-05-08 MED ORDER — NITROFURANTOIN MONOHYD MACRO 100 MG PO CAPS
ORAL_CAPSULE | ORAL | 0 refills | Status: DC
  Filled 2021-05-08: qty 14, 7d supply, fill #0

## 2021-05-10 ENCOUNTER — Other Ambulatory Visit: Payer: Self-pay

## 2021-05-11 ENCOUNTER — Other Ambulatory Visit: Payer: Self-pay

## 2021-05-28 ENCOUNTER — Other Ambulatory Visit: Payer: Self-pay

## 2021-05-28 ENCOUNTER — Ambulatory Visit: Payer: No Typology Code available for payment source | Admitting: Family Medicine

## 2021-06-15 ENCOUNTER — Other Ambulatory Visit: Payer: Self-pay

## 2021-06-15 ENCOUNTER — Telehealth: Payer: Self-pay

## 2021-06-15 DIAGNOSIS — G43009 Migraine without aura, not intractable, without status migrainosus: Secondary | ICD-10-CM

## 2021-06-16 ENCOUNTER — Other Ambulatory Visit: Payer: Self-pay

## 2021-06-16 MED ORDER — SUMATRIPTAN SUCCINATE 50 MG PO TABS
50.0000 mg | ORAL_TABLET | Freq: Once | ORAL | 2 refills | Status: DC
  Filled 2021-06-16: qty 9, 15d supply, fill #0

## 2021-06-16 NOTE — Telephone Encounter (Signed)
Sent to pharmacy 

## 2021-08-30 ENCOUNTER — Other Ambulatory Visit: Payer: Self-pay

## 2021-08-30 MED ORDER — NITROFURANTOIN MONOHYD MACRO 100 MG PO CAPS
ORAL_CAPSULE | ORAL | 1 refills | Status: DC
  Filled 2021-08-30: qty 13, 6d supply, fill #0
  Filled 2021-08-30: qty 1, 1d supply, fill #0
  Filled 2021-11-01: qty 14, 7d supply, fill #1

## 2021-09-16 ENCOUNTER — Other Ambulatory Visit: Payer: Self-pay

## 2021-09-16 MED ORDER — AMOXICILLIN-POT CLAVULANATE 875-125 MG PO TABS
1.0000 | ORAL_TABLET | Freq: Two times a day (BID) | ORAL | 0 refills | Status: DC
  Filled 2021-09-16: qty 20, 10d supply, fill #0

## 2021-09-29 ENCOUNTER — Other Ambulatory Visit: Payer: Self-pay | Admitting: Family Medicine

## 2021-09-29 ENCOUNTER — Other Ambulatory Visit: Payer: Self-pay | Admitting: Optometry

## 2021-09-29 DIAGNOSIS — Z1231 Encounter for screening mammogram for malignant neoplasm of breast: Secondary | ICD-10-CM

## 2021-10-12 ENCOUNTER — Other Ambulatory Visit: Payer: Self-pay | Admitting: Obstetrics and Gynecology

## 2021-10-12 ENCOUNTER — Other Ambulatory Visit: Payer: Self-pay

## 2021-10-14 ENCOUNTER — Other Ambulatory Visit: Payer: Self-pay

## 2021-11-01 ENCOUNTER — Other Ambulatory Visit: Payer: Self-pay

## 2021-11-05 ENCOUNTER — Ambulatory Visit
Admission: RE | Admit: 2021-11-05 | Discharge: 2021-11-05 | Disposition: A | Discharge status: Home / Self Care | Payer: No Typology Code available for payment source | Source: Ambulatory Visit | Attending: Family Medicine | Admitting: Family Medicine

## 2021-11-05 DIAGNOSIS — Z1231 Encounter for screening mammogram for malignant neoplasm of breast: Secondary | ICD-10-CM

## 2021-11-10 NOTE — Progress Notes (Unsigned)
No chief complaint on file.   HPI:      [name] is a 51 y.o. [phone] who LMP was No LMP recorded. (Menstrual status: IUD)., presents today for her annual examination.  Her menses are every few months with light bleeding for a couple days with IUD.  Dysmenorrhea none. She does not have intermenstrual bleeding. Has occas vasomotor sx.  Sex activity: single partner, contraception - IUD. Mirena placed 02/20/17. Has occas vaginal dryness, improved with lubricants. Last Pap: 11/10/20 Results were: no abnormalities /neg HPV DNA  Hx of STDs: HPV  Last mammogram: 11/05/21 Results were: normal--routine follow-up in 12 months. Done with PCP. There is a FH of breast cancer in her mom and possibly MGM (cancer was widespread--no primary site determined but thought to be breast or colon etiology, died age 58). Pt is MyRisk neg except NBN VUS 8/21. IBIS=25.0%/riskscore=26.7%. There is no FH of ovarian cancer. The patient does do self-breast exams. Had neg breast MRI 05/07/21. Takes Vit D supp.  Tobacco use: The patient denies current or previous tobacco use. Alcohol use: none  No drug use Exercise: mod active  Colonoscopy: 2/21, repeat after 5 yrs due to [facility] colon cancer   She does get adequate calcium and Vitamin D in her diet. Had normal levels on recent labs. Labs with PCP. Has pre-DM and borderline lipids, trying to lose wt.   Past Medical History:  Diagnosis Date   Allergy    Claritin and Flonas helps manage symptoms   BRCA negative 10/2019   MyRisk neg except NBN VUS   Family history of breast cancer 10/2019   History of abnormal cervical Pap smear    2009;2013 LGSIL - 10/13/14 neg   History of mammogram 10/13/2014   neg   History of Papanicolaou smear of cervix 10/13/2014   neg   Increased risk of breast cancer 10/2019   IBIS=25.0%/riskscore=26.7%   Migraine    UTI (lower urinary tract infection)    Seen by urology. Macrobid prn    Past Surgical History:  Procedure  Laterality Date   COLPOSCOPY  12/02/2011   INTRAUTERINE DEVICE (IUD) INSERTION     SEPTOPLASTY  2000    Family History  Problem Relation Age of Onset   Cancer Maternal Grandmother 35       colon cancer, breast cancer, brain cancer   Hyperlipidemia Paternal Grandmother    Hypertension Paternal Grandmother    Heart disease Paternal Grandmother 29   Cancer Paternal Grandmother 58       leukemia   Hyperlipidemia Paternal Grandfather    Stroke Paternal Grandfather    Hypertension Paternal Grandfather    Aneurysm Paternal Grandfather    Heart disease Paternal Grandfather    Hypothyroidism Father    Hypothyroidism Sister    Breast cancer Mother 65   Heart disease Maternal Grandfather 62   Cancer Other 17       colon    Social History   Socioeconomic History   Marital status: Married    Spouse name: [name]   Number of children: 2   Years of education: 16   Highest education level: Not on file  Occupational History   Occupation: Therapist, sports - Occupational Health    Comment: [name]  Tobacco Use   Smoking status: Never   Smokeless tobacco: Never  Vaping Use   Vaping Use: Never used  Substance and Sexual Activity   Alcohol use: Yes    Alcohol/week: 0.0 standard drinks of alcohol  Comment: occ   Drug use: No   Sexual activity: Yes    Birth control/protection: I.U.D.    Comment: mirena  Other Topics Concern   Not on file  Social History Narrative   Susan Bentley grew up in [place]. She lives at home with her husband, [name], of 3 years. Her youngest son, [name], is a Paramedic in [employer] and lives at home with them. Susan Bentley has another son, [name], who is away at college at [employer]. Susan Bentley attended [employer] and obtained her Bachelors in Nursing. 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 She also enjoys gardening in the summer. She also quilts.   Social Determinants of Health   Financial Resource Strain: Not on file  Food Insecurity: Not  on file  Transportation Needs: Not on file  Physical Activity: Not on file  Stress: Not on file  Social Connections: Not on file  Intimate Partner Violence: Not on file     Current Outpatient Medications:    amoxicillin-clavulanate (AUGMENTIN) 875-125 MG tablet, Take 1 tablet by mouth 2 (two) times daily., Disp: 20 tablet, Rfl: 0   COVID-19 mRNA Vac-TriS, Pfizer, (PFIZER-BIONT COVID-19 VAC-TRIS) SUSP injection, Inject into the muscle., Disp: 0.3 mL, Rfl: 0   fluconazole (DIFLUCAN) 150 MG tablet, Take 1 tablet (150 mg total) by mouth every other day for 2 doses., Disp: 2 tablet, Rfl: 1   fluticasone (FLONASE) 50 MCG/ACT nasal spray, Place 1 spray into both nostrils daily., Disp: 16 g, Rfl: 11   levonorgestrel (MIRENA) 20 MCG/24HR IUD, 1 Intra Uterine Device (1 each total) by Intrauterine route once for 1 dose., Disp: 1 each, Rfl: 0   Loratadine 10 MG CAPS, Take 1 tablet by mouth daily., Disp: , Rfl:    meloxicam (MOBIC) 15 MG tablet, Take one tablet by mouth daily, Disp: 30 tablet, Rfl: 4   meloxicam (MOBIC) 15 MG tablet, Take 1 tablet every day by oral route with meals., Disp: 30 tablet, Rfl: 2   Meth-Hyo-M Bl-Na Phos-Ph Sal (URIBEL) 118 MG CAPS, Take 1 capsule (118 mg total) by mouth daily as needed., Disp: 90 capsule, Rfl: 3   nitrofurantoin (MACRODANTIN) 50 MG capsule, Take 1 capsule (50 mg total) by mouth as needed., Disp: 30 capsule, Rfl: 1   nitrofurantoin, macrocrystal-monohydrate, (MACROBID) 100 MG capsule, take one capsule by mouth twice a day for 7 days, Disp: 14 capsule, Rfl: 0   nitrofurantoin, macrocrystal-monohydrate, (MACROBID) 100 MG capsule, Take 1 capsule by mouth twice a day for 7 days, Disp: 14 capsule, Rfl: 1   oseltamivir (TAMIFLU) 75 MG capsule, Take 1 capsule (75 mg total) bymouth daily for 10 days, Disp: 10 capsule, Rfl: 0   SUMAtriptan (IMITREX) 50 MG tablet, Take 1 tablet (50 mg total) by mouth once for 1 dose. May repeat in 2 hours if headache persists or recurs. No  more than 2 doses in 24 hours., Disp: 10 tablet, Rfl: 2  ROS:  Review of Systems  Constitutional:  Negative for fatigue, fever and unexpected weight change.  Respiratory:  Negative for cough, shortness of breath and wheezing.   Cardiovascular:  Negative for chest pain, palpitations and leg swelling.  Gastrointestinal:  Negative for blood in stool, constipation, diarrhea, nausea and vomiting.  Endocrine: Negative for cold intolerance, heat intolerance and polyuria.  Genitourinary:  Negative for dyspareunia, dysuria, flank pain, frequency, genital sores, hematuria, menstrual problem, pelvic pain, urgency, vaginal bleeding, vaginal discharge and vaginal pain.  Musculoskeletal:  Negative for back  pain, joint swelling and myalgias.  Skin:  Negative for rash.  Neurological:  Negative for dizziness, syncope, light-headedness, numbness and headaches.  Hematological:  Negative for adenopathy.  Psychiatric/Behavioral:  Negative for agitation, confusion, sleep disturbance and suicidal ideas. The patient is not nervous/anxious.      Objective: There were no vitals taken for this visit.   Physical Exam Constitutional:      Appearance: She is well-developed.  Genitourinary:     Vulva normal.     Right Labia: No rash, tenderness or lesions.    Left Labia: No tenderness, lesions or rash.    No vaginal discharge, erythema or tenderness.      Right Adnexa: not tender and no mass present.    Left Adnexa: not tender and no mass present.    No cervical motion tenderness, friability or polyp.     IUD strings visualized.     Uterus is not enlarged or tender.  Breasts:    Right: No mass, nipple discharge, skin change or tenderness.     Left: No mass, nipple discharge, skin change or tenderness.  Neck:     Thyroid: No thyromegaly.  Cardiovascular:     Rate and Rhythm: Normal rate and regular rhythm.     Heart sounds: Normal heart sounds. No murmur heard. Pulmonary:     Effort: Pulmonary effort  is normal.     Breath sounds: Normal breath sounds.  Abdominal:     Palpations: Abdomen is soft.     Tenderness: There is no abdominal tenderness. There is no guarding or rebound.  Musculoskeletal:        General: Normal range of motion.     Cervical back: Normal range of motion.  Lymphadenopathy:     Cervical: No cervical adenopathy.  Neurological:     General: No focal deficit present.     Mental Status: She is alert and oriented to person, place, and time.     Cranial Nerves: No cranial nerve deficit.  Skin:    General: Skin is warm and dry.  Psychiatric:        Mood and Affect: Mood normal.        Behavior: Behavior normal.        Thought Content: Thought content normal.        Judgment: Judgment normal.  Vitals reviewed.     Assessment/Plan: Encounter for annual routine gynecological examination  Cervical cancer screening - Plan: Cytology - PAP  Screening for HPV (human papillomavirus) - Plan: Cytology - PAP  Encounter for screening mammogram for malignant neoplasm of breast--pt current on mammo.  Family history of breast cancer - Plan: Integrated BRACAnalysis ([place]); MyRisk testing neg  Increased risk of breast cancer--pt aware of monthly SBE, yearly CBE and mammos. Did scr breast MRI last yr, will call by 12/22 if would like again by 2/23. May want to go someplace other than [facility] due to bad experience.             GYN counsel mammography screening, adequate intake of calcium and vitamin D     F/U  No follow-ups on file.  Susan Bentley B. Yona Stansbury, PA-C 11/10/2021 8:26 PM

## 2021-11-11 ENCOUNTER — Ambulatory Visit (INDEPENDENT_AMBULATORY_CARE_PROVIDER_SITE_OTHER): Payer: No Typology Code available for payment source | Admitting: Obstetrics and Gynecology

## 2021-11-11 ENCOUNTER — Encounter: Payer: Self-pay | Admitting: Obstetrics and Gynecology

## 2021-11-11 VITALS — BP 130/90 | Ht 68.0 in | Wt 272.0 lb

## 2021-11-11 DIAGNOSIS — Z803 Family history of malignant neoplasm of breast: Secondary | ICD-10-CM | POA: Diagnosis not present

## 2021-11-11 DIAGNOSIS — Z01419 Encounter for gynecological examination (general) (routine) without abnormal findings: Secondary | ICD-10-CM | POA: Diagnosis not present

## 2021-11-11 DIAGNOSIS — Z30431 Encounter for routine checking of intrauterine contraceptive device: Secondary | ICD-10-CM | POA: Diagnosis not present

## 2021-11-11 DIAGNOSIS — Z9189 Other specified personal risk factors, not elsewhere classified: Secondary | ICD-10-CM

## 2021-11-11 DIAGNOSIS — Z1231 Encounter for screening mammogram for malignant neoplasm of breast: Secondary | ICD-10-CM | POA: Diagnosis not present

## 2021-11-11 NOTE — Patient Instructions (Signed)
I value your feedback and you entrusting us with your care. If you get a [facility] patient survey, I would appreciate you taking the time to let us know about your experience today. Thank you! ? ? ?

## 2021-11-24 ENCOUNTER — Other Ambulatory Visit: Payer: Self-pay

## 2021-11-24 MED ORDER — HYDROXYZINE HCL 10 MG PO TABS
ORAL_TABLET | ORAL | 0 refills | Status: DC
  Filled 2021-11-24: qty 60, 30d supply, fill #0

## 2021-12-21 LAB — COMPREHENSIVE METABOLIC PANEL
Albumin: 4 (ref 3.5–5.0)
Calcium: 9.3 (ref 8.7–10.7)
Globulin: 2.5
eGFR: 78

## 2021-12-21 LAB — HEPATIC FUNCTION PANEL
AST: 18 (ref 13–35)
Alkaline Phosphatase: 17 — AB (ref 25–125)
Bilirubin, Total: 0.2

## 2021-12-21 LAB — CBC AND DIFFERENTIAL
HCT: 43 (ref 36–46)
Hemoglobin: 13.1 (ref 12.0–16.0)
Neutrophils Absolute: 66
Platelets: 448 10*3/uL — AB (ref 150–400)
WBC: 7

## 2021-12-21 LAB — LIPID PANEL
Cholesterol: 222 — AB (ref 0–200)
HDL: 37 (ref 35–70)
LDL Cholesterol: 142
Triglycerides: 238 — AB (ref 40–160)

## 2021-12-21 LAB — VITAMIN D 25 HYDROXY (VIT D DEFICIENCY, FRACTURES): Vit D, 25-Hydroxy: 43.9

## 2021-12-21 LAB — BASIC METABOLIC PANEL
BUN: 15 (ref 4–21)
Chloride: 99 (ref 99–108)
Creatinine: 0.9 (ref <=1.1)
Potassium: 4.7 mEq/L (ref 3.5–5.1)
Sodium: 139 (ref 137–147)

## 2021-12-21 LAB — CBC: RBC: 5.09 (ref 3.87–5.11)

## 2021-12-21 LAB — HEMOGLOBIN A1C: Hemoglobin A1C: 5.9

## 2021-12-21 LAB — TSH: TSH: 2.81 (ref <=5.90)

## 2021-12-21 LAB — VITAMIN B12: Vitamin B-12: 468

## 2021-12-27 ENCOUNTER — Ambulatory Visit (INDEPENDENT_AMBULATORY_CARE_PROVIDER_SITE_OTHER): Payer: No Typology Code available for payment source | Admitting: Family Medicine

## 2021-12-27 ENCOUNTER — Other Ambulatory Visit: Payer: Self-pay

## 2021-12-27 ENCOUNTER — Encounter: Payer: Self-pay | Admitting: Family Medicine

## 2021-12-27 VITALS — BP 110/78 | HR 85 | Temp 98.0°F | Ht 68.0 in | Wt 269.4 lb

## 2021-12-27 DIAGNOSIS — Z0001 Encounter for general adult medical examination with abnormal findings: Secondary | ICD-10-CM

## 2021-12-27 DIAGNOSIS — G43009 Migraine without aura, not intractable, without status migrainosus: Secondary | ICD-10-CM | POA: Diagnosis not present

## 2021-12-27 DIAGNOSIS — H9313 Tinnitus, bilateral: Secondary | ICD-10-CM

## 2021-12-27 DIAGNOSIS — J309 Allergic rhinitis, unspecified: Secondary | ICD-10-CM

## 2021-12-27 MED ORDER — SUMATRIPTAN SUCCINATE 50 MG PO TABS
50.0000 mg | ORAL_TABLET | Freq: Once | ORAL | 2 refills | Status: DC
  Filled 2021-12-27: qty 9, 15d supply, fill #0
  Filled 2022-06-20: qty 9, 15d supply, fill #1

## 2021-12-27 MED ORDER — TRIAMCINOLONE ACETONIDE 55 MCG/ACT NA AERO
2.0000 | INHALATION_SPRAY | Freq: Every day | NASAL | 12 refills | Status: DC
  Filled 2021-12-27: qty 1, 1d supply, fill #0

## 2021-12-27 NOTE — Progress Notes (Signed)
Susan Rumps, MD Phone: [phone]  Susan Bentley is a 51 y.o. female who presents today for CPE.  Diet: trying to eat healthier, has reduced portion sizes some Exercise: walks 3x/week Pap smear: 11/10/20 negative HPV NILM Colonoscopy: 02/11/19 5 year recall Mammogram: 11/05/21 negative Family history-  Colon cancer: father  Breast cancer: mother  Ovarian cancer: no Menses: went 8 months without one, though they started back in July, she discussed this with GYN Vaccines-   Flu: through work  Tetanus: UTD  Shingles: UTD - reports had in 2020 or 2021  COVID19: x3 HIV screening: UTD Hep C Screening: UTD Tobacco use: no Alcohol use: rarely has one drink Illicit Drug use: no Dentist: yes Ophthalmology: yes  Tinnitus/allergic rhinitis: Patient notes she started to have some low-frequency ringing in her ears shortly after having vertigo after the shingles vaccine.  She notes no hearing changes.  She notes occasional ear fullness.  She does report some congestion and runny nose at times with her allergies.  She still uses Flonase and an oral second-generation antihistamine.  She does not take aspirin.  She does not take an excessive amount of NSAIDs.   Active Ambulatory Problems    Diagnosis Date Noted   Encounter for general adult medical examination with abnormal findings 03/29/2013   GERD (gastroesophageal reflux disease) 03/29/2013   History of recurrent UTI (urinary tract infection) 03/29/2013   Headache 03/29/2013   Hypertriglyceridemia 10/17/2018   Elevated uric acid in blood 10/17/2018   Family history of colon cancer in father 10/17/2018   Migraines 05/28/2019   Prediabetes 11/27/2019   COVID-19 08/03/2020   Family history of breast cancer 11/10/2020   Increased risk of breast cancer 11/10/2020   Arthralgia 11/27/2020   Impingement syndrome of shoulder region 03/19/2019   Allergic rhinitis 12/27/2021   Tinnitus aurium, bilateral 12/27/2021   Resolved  Ambulatory Problems    Diagnosis Date Noted   No Resolved Ambulatory Problems   Past Medical History:  Diagnosis Date   Allergy    BRCA negative 10/2019   History of abnormal cervical Pap smear    History of mammogram 10/13/2014   History of Papanicolaou smear of cervix 10/13/2014   Migraine    UTI (lower urinary tract infection)     Family History  Problem Relation Age of Onset   Cancer Maternal Grandmother 36       colon cancer, breast cancer, brain cancer   Hyperlipidemia Paternal Grandmother    Hypertension Paternal Grandmother    Heart disease Paternal Grandmother 57   Cancer Paternal Grandmother 42       leukemia   Hyperlipidemia Paternal Grandfather    Stroke Paternal Grandfather    Hypertension Paternal Grandfather    Aneurysm Paternal Grandfather    Heart disease Paternal Grandfather    Hypothyroidism Father    Hypothyroidism Sister    Breast cancer Mother 20   Heart disease Maternal Grandfather 33   Cancer Other 108       colon    Social History   Socioeconomic History   Marital status: Married    Spouse name: Susan Bentley   Number of children: 2   Years of education: 16   Highest education level: Not on file  Occupational History   Occupation: Therapist, sports - Occupational Health    Comment: [name]  Tobacco Use   Smoking status: Never   Smokeless tobacco: Never  Vaping Use   Vaping Use: Never used  Substance and Sexual Activity   Alcohol use:  Yes    Alcohol/week: 0.0 standard drinks of alcohol    Comment: occ   Drug use: No   Sexual activity: Yes    Birth control/protection: I.U.D.    Comment: mirena  Other Topics Concern   Not on file  Social History Narrative   Susan Bentley grew up in [place]. She lives at home with her husband, Susan Bentley, of 3 years. Her youngest son, Susan Bentley, is a Paramedic in [employer] and lives at home with them. Susan Bentley has another son, Susan Bentley, who is away at college at [employer]. Susan Bentley attended [employer] and obtained her Bachelors in  Nursing. 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 She also enjoys gardening in the summer. She also quilts.   Social Determinants of Health   Financial Resource Strain: Not on file  Food Insecurity: Not on file  Transportation Needs: Not on file  Physical Activity: Not on file  Stress: Not on file  Social Connections: Not on file  Intimate Partner Violence: Not on file    ROS  General:  Negative for nexplained weight loss, fever Skin: Negative for new or changing mole, sore that won't heal HEENT: Positive for tinnitus, negative for trouble hearing, trouble seeing, mouth sores, hoarseness, change in voice, dysphagia. CV:  Negative for chest pain, dyspnea, edema, palpitations Resp: Negative for cough, dyspnea, hemoptysis GI: Negative for nausea, vomiting, diarrhea, constipation, abdominal pain, melena, hematochezia. GU: Positive for stress incontinence (chronic), negative for dysuria, urinary hesitance, hematuria, vaginal or penile discharge, polyuria, sexual difficulty, lumps in testicle or breasts MSK: Positive for joint pain (chronic/intermittent), negative for muscle cramps or aches, joint pain or swelling Neuro: Positive for headaches (chronic issues with migraine that occur occasionally), negative for weakness, numbness, dizziness, passing out/fainting Psych: Negative for depression, anxiety, memory problems  Objective  Physical Exam Vitals:   12/27/21 0824  BP: 110/78  Pulse: 85  Temp: 98 F (36.7 C)  SpO2: 95%    BP Readings from Last 3 Encounters:  12/27/21 110/78  11/11/21 (!) 130/90  11/27/20 122/78   Wt Readings from Last 3 Encounters:  12/27/21 269 lb 6.4 oz (122.2 kg)  11/11/21 272 lb (123.4 kg)  11/27/20 258 lb (117 kg)    Physical Exam Constitutional:      General: She is not in acute distress.    Appearance: She is not diaphoretic.  HENT:     Head: Normocephalic and atraumatic.     Right Ear: Tympanic membrane  normal.     Left Ear: Tympanic membrane normal.  Cardiovascular:     Rate and Rhythm: Normal rate and regular rhythm.     Heart sounds: Normal heart sounds.  Pulmonary:     Effort: Pulmonary effort is normal.     Breath sounds: Normal breath sounds.  Abdominal:     General: Bowel sounds are normal. There is no distension.     Palpations: Abdomen is soft.     Tenderness: There is no abdominal tenderness.  Musculoskeletal:     Right lower leg: No edema.     Left lower leg: No edema.  Lymphadenopathy:     Cervical: No cervical adenopathy.  Skin:    General: Skin is warm and dry.  Neurological:     Mental Status: She is alert.  Psychiatric:        Mood and Affect: Mood normal.      Assessment/Plan:   Problem List Items Addressed This Visit     Allergic  rhinitis    We will switch her over to Nasacort to see if this is more beneficial for her allergy symptoms.  She can also switch up her second-generation antihistamine to something she has not been on previously.  Discussed that her intermittent ear fullness from her allergies could be contributing to her tinnitus.      Relevant Medications   triamcinolone (NASACORT) 55 MCG/ACT AERO nasal inhaler   Encounter for general adult medical examination with abnormal findings - Primary    Physical exam completed.  I encouraged healthy diet and exercise.  Discussed increasing her exercise if possible.  Cancer screening is up-to-date.  She will get her flu vaccine through work.  We will confirm her Shingrix vaccine dates.  She brought in lab work that was reviewed.  She does have prediabetes and I discussed the importance of diet and exercise for that.  Her ASCVD risk score does not meet criteria for cholesterol treatment.  Labs will be scanned into the chart.  She will continue to follow with GYN for her menstrual cycle irregularity.      Migraines   Relevant Medications   SUMAtriptan (IMITREX) 50 MG tablet   Tinnitus aurium, bilateral     Mild bilateral tinnitus.  Discussed this could be related to her allergy issues though it did start after getting the shingles vaccine and it is certainly possible that there was some nerve irritation after getting the shingles shot that resulted in some persistent mild tinnitus.  Given that it is mild and bilateral I do not think she needs to see ENT at this time though I did discuss if she has any changes or becomes unilateral or she develops other symptoms with this such as hearing loss she needs to let us know so we can refer to ENT.      The 10-year ASCVD risk score (Arnett DK, et al., 2019) is: 2%*   Values used to calculate the score:     Age: 13 years     Sex: Female     Is Non-Hispanic African American: No     Diabetic: No     Tobacco smoker: No     Systolic Blood Pressure: 833 mmHg     Is BP treated: No     HDL Cholesterol: 37 mg/dL*     Total Cholesterol: 222 mg/dL*     * - Cholesterol units were assumed for this score calculation   Return in about 1 year (around 12/28/2022) for CPE.   Susan Rumps, MD [facility]

## 2021-12-27 NOTE — Assessment & Plan Note (Signed)
We will switch her over to Nasacort to see if this is more beneficial for her allergy symptoms.  She can also switch up her second-generation antihistamine to something she has not been on previously.  Discussed that her intermittent ear fullness from her allergies could be contributing to her tinnitus.

## 2021-12-27 NOTE — Assessment & Plan Note (Signed)
Mild bilateral tinnitus.  Discussed this could be related to her allergy issues though it did start after getting the shingles vaccine and it is certainly possible that there was some nerve irritation after getting the shingles shot that resulted in some persistent mild tinnitus.  Given that it is mild and bilateral I do not think she needs to see ENT at this time though I did discuss if she has any changes or becomes unilateral or she develops other symptoms with this such as hearing loss she needs to let us know so we can refer to ENT.

## 2021-12-27 NOTE — Assessment & Plan Note (Addendum)
Physical exam completed.  I encouraged healthy diet and exercise.  Discussed increasing her exercise if possible.  Cancer screening is up-to-date.  She will get her flu vaccine through work.  We will confirm her Shingrix vaccine dates.  She brought in lab work that was reviewed.  She does have prediabetes and I discussed the importance of diet and exercise for that.  Her ASCVD risk score does not meet criteria for cholesterol treatment.  Labs will be scanned into the chart.  She will continue to follow with GYN for her menstrual cycle irregularity.

## 2021-12-27 NOTE — Patient Instructions (Signed)
Nice to see you. Please continue to work on diet and exercise. Please get your flu vaccine through work.  You can consider getting the updated COVID-vaccine at this time. Please try the Nasacort and see if that helps with your allergy symptoms.  If its not beneficial please let me know. If you notice any increase in the ringing in your ears, ear fullness, hearing loss, or unilateral ringing in your ears please let us know so we can get you to see ENT.

## 2022-01-28 ENCOUNTER — Encounter: Payer: Self-pay | Admitting: Family Medicine

## 2022-01-28 DIAGNOSIS — M25512 Pain in left shoulder: Secondary | ICD-10-CM

## 2022-01-28 DIAGNOSIS — Z1283 Encounter for screening for malignant neoplasm of skin: Secondary | ICD-10-CM

## 2022-01-31 ENCOUNTER — Telehealth: Payer: Self-pay | Admitting: Family Medicine

## 2022-01-31 NOTE — Telephone Encounter (Signed)
Barbaraann Share called from [facility] stating they receive a referral for the pt and they believe it has to go to ENT

## 2022-02-10 ENCOUNTER — Encounter: Payer: Self-pay | Admitting: Obstetrics and Gynecology

## 2022-02-10 DIAGNOSIS — Z30431 Encounter for routine checking of intrauterine contraceptive device: Secondary | ICD-10-CM

## 2022-02-10 DIAGNOSIS — N939 Abnormal uterine and vaginal bleeding, unspecified: Secondary | ICD-10-CM

## 2022-02-24 ENCOUNTER — Ambulatory Visit (INDEPENDENT_AMBULATORY_CARE_PROVIDER_SITE_OTHER): Payer: No Typology Code available for payment source

## 2022-02-24 DIAGNOSIS — N939 Abnormal uterine and vaginal bleeding, unspecified: Secondary | ICD-10-CM

## 2022-02-24 DIAGNOSIS — Z30431 Encounter for routine checking of intrauterine contraceptive device: Secondary | ICD-10-CM

## 2022-03-02 NOTE — Progress Notes (Unsigned)
   No chief complaint on file.    History of Present Illness:  Susan Bentley is a 51 y.o. that had a {IUD:23561} IUD placed approximately {NUMBERS:20191} {MONTH/YR:310907} ago. Since that time, she denies dyspareunia, pelvic pain, non-menstrual bleeding, vaginal d/c, heavy bleeding. Mirena placed 02/20/17. Pt with irregular bleeding, heavyflow with clots at times, witrh cramping, since 7/23. Had neg GYN u/s 11/23 with IUD in correct location. Will do IUD replacement for sx   There were no vitals taken for this visit.  Pelvic exam:  Two IUD strings {DESC; PRESENT/ABSENT:17923} seen coming from the cervical os. EGBUS, vaginal vault and cervix: within normal limits  IUD Removal Strings of IUD identified and grasped.  IUD removed without problem with ring forceps.  Pt tolerated this well.  IUD noted to be intact.  Assessment:  No diagnosis found.    Plan: IUD removed and plan for contraception is {PLAN CONTRACEPTION:313102}. She was amenable to this plan.  Edona Schreffler B. Nikia Levels, PA-C 03/02/2022 8:49 PM   No chief complaint on file.    IUD PROCEDURE NOTE:  Susan Bentley is a 51 y.o. [id number] here for {type:23561}  IUD insertion for ***   There were no vitals taken for this visit.  IUD Insertion Procedure Note Patient identified, informed consent performed, consent signed.   Discussed risks of irregular bleeding, cramping, infection, malpositioning or misplacement of the IUD outside the uterus which may require further procedure such as laparoscopy, risk of failure <1%. Time out was performed.  Urine pregnancy test negative.***  Speculum placed in the vagina.  Cervix visualized.  Cleaned with Betadine x 2.  Grasped anteriorly with a single tooth tenaculum.  Uterus sounded to *** cm.   IUD placed per manufacturer's recommendations.  Strings trimmed to 3 cm. Tenaculum was removed, good hemostasis noted.  Patient tolerated procedure well.   ASSESSMENT:  No diagnosis found.   No  orders of the defined types were placed in this encounter.    Plan:  Patient was given post-procedure instructions.  She was advised to have backup contraception for one week.   Call if you are having increasing pain, cramps or bleeding or if you have a fever greater than 100.4 degrees F., shaking chills, nausea or vomiting. Patient was also asked to check IUD strings periodically and follow up in 4 weeks for IUD check.  No follow-ups on file.  Cherisse Carrell B. Kateria Cutrona, PA-C 03/02/2022 8:49 PM

## 2022-03-03 ENCOUNTER — Encounter: Payer: Self-pay | Admitting: Obstetrics and Gynecology

## 2022-03-03 ENCOUNTER — Ambulatory Visit (INDEPENDENT_AMBULATORY_CARE_PROVIDER_SITE_OTHER): Payer: No Typology Code available for payment source | Admitting: Obstetrics and Gynecology

## 2022-03-03 VITALS — BP 122/80 | HR 89 | Resp 16 | Wt 271.4 lb

## 2022-03-03 DIAGNOSIS — Z30433 Encounter for removal and reinsertion of intrauterine contraceptive device: Secondary | ICD-10-CM

## 2022-03-03 MED ORDER — LEVONORGESTREL 20 MCG/DAY IU IUD
1.0000 | INTRAUTERINE_SYSTEM | Freq: Once | INTRAUTERINE | Status: AC
  Administered 2022-03-03: 1 via INTRAUTERINE

## 2022-03-03 NOTE — Patient Instructions (Signed)
I value your feedback and you entrusting us with your care. If you get a [facility] patient survey, I would appreciate you taking the time to let us know about your experience today. Thank you!  Instructions after IUD insertion  Most women experience no significant problems after insertion of an IUD, however minor cramping and spotting for a few days is common. Cramps may be treated with ibuprofen 800mg every 8 hours or Tylenol 650 mg every 4 hours. Contact [facility] immediately if you experience any of the following symptoms during the next week: temperature >99.6 degrees, worsening pelvic pain, abdominal pain, fainting, unusually heavy vaginal bleeding, foul vaginal discharge, or if you think you have expelled the IUD. Nothing inserted in the vagina for 48 hours. You will be scheduled for a follow up visit in approximately four weeks.    

## 2022-03-19 ENCOUNTER — Telehealth: Payer: No Typology Code available for payment source | Admitting: Urgent Care

## 2022-03-19 DIAGNOSIS — U071 COVID-19: Secondary | ICD-10-CM

## 2022-03-19 NOTE — Patient Instructions (Signed)
  Kayla S Monteverde, thank you for joining Chaney Malling, PA for today's virtual visit.  While this provider is not your primary care provider (PCP), if your PCP is located in our provider database this encounter information will be shared with them immediately following your visit.   [facility] account gives you access to today's visit and all your visits, tests, and labs performed at [facility] " click here if you don't have a [facility] account or go to [email or web address]  Consent: (Patient) Susan Bentley provided verbal consent for this virtual visit at the beginning of the encounter.  Current Medications:  Current Outpatient Medications:    levonorgestrel (MIRENA) 20 MCG/DAY IUD, 1 each by Intrauterine route once., Disp: , Rfl:    Loratadine 10 MG CAPS, Take 1 tablet by mouth daily., Disp: , Rfl:    Meth-Hyo-M Bl-Na Phos-Ph Sal (URIBEL) 118 MG CAPS, Take 1 capsule (118 mg total) by mouth daily as needed., Disp: 90 capsule, Rfl: 3   nitrofurantoin, macrocrystal-monohydrate, (MACROBID) 100 MG capsule, take one capsule by mouth twice a day for 7 days (Patient not taking: Reported on 03/03/2022), Disp: 14 capsule, Rfl: 0   SUMAtriptan (IMITREX) 50 MG tablet, Take 1 tablet (50 mg total) by mouth once for 1 dose. May repeat in 2 hours if headache persists or recurs. No more than 2 doses in 24 hours., Disp: 9 tablet, Rfl: 2   triamcinolone (NASACORT) 55 MCG/ACT AERO nasal inhaler, Place 2 sprays into the nose daily., Disp: 16.9 mL, Rfl: 12   Medications ordered in this encounter:  No orders of the defined types were placed in this encounter.    *If you need refills on other medications prior to your next appointment, please contact your pharmacy*  Follow-Up: Call back or seek an in-person evaluation if the symptoms worsen or if the condition fails to improve as anticipated.  [place] [phone]  Other Instructions Please continue  use of your nasal spray as this has helped with your nasal congestion. You are past the window of treatment for covid. Please rest and stay hydrated. You may use OTC mucinex to help with any mucous or chest congestion. You are past the 5 day isolation period. Please consider wearing an N95 mask for an additional 5 days. If you develop fever, shortness of breath or chest pain, please head to an in person urgent care for further evaluation.   If you have been instructed to have an in-person evaluation today at a local Urgent Care facility, please use the link below. It will take you to a list of all of our available [facility], including address, phone number and hours of operation. Please do not delay care.  [facility]  If you or a family member do not have a primary care provider, use the link below to schedule a visit and establish care. When you choose a [facility] primary care physician or advanced practice provider, you gain a long-term partner in health. Find a Primary Care Provider  Learn more about [facility]'s in-office and virtual care options: [facility]

## 2022-03-19 NOTE — Progress Notes (Signed)
Virtual Visit Consent   Susan Bentley, you are scheduled for a virtual visit with a [facility] provider today. Just as with appointments in the office, your consent must be obtained to participate. Your consent will be active for this visit and any virtual visit you may have with one of our providers in the next 365 days. If you have a MyChart account, a copy of this consent can be sent to you electronically.  As this is a virtual visit, video technology does not allow for your provider to perform a traditional examination. This may limit your provider's ability to fully assess your condition. If your provider identifies any concerns that need to be evaluated in person or the need to arrange testing (such as labs, EKG, etc.), we will make arrangements to do so. Although advances in technology are sophisticated, we cannot ensure that it will always work on either your end or our end. If the connection with a video visit is poor, the visit may have to be switched to a telephone visit. With either a video or telephone visit, we are not always able to ensure that we have a secure connection.  By engaging in this virtual visit, you consent to the provision of healthcare and authorize for your insurance to be billed (if applicable) for the services provided during this visit. Depending on your insurance coverage, you may receive a charge related to this service.  I need to obtain your verbal consent now. Are you willing to proceed with your visit today? Susan Bentley has provided verbal consent on 03/19/2022 for a virtual visit (video or telephone). Susan Malling, PA  Date: 03/19/2022 10:06 AM  Virtual Visit via Video Note   I, Susan Bentley, connected with  Susan Bentley  ([id number], 06/11/1970) on 03/19/22 at 10:00 AM EST by a video-enabled telemedicine application and verified that I am speaking with the correct person using two identifiers.  Location: Patient: Virtual Visit Location Patient:  Home Provider: Virtual Visit Location Provider: Home Office   I discussed the limitations of evaluation and management by telemedicine and the availability of in person appointments. The patient expressed understanding and agreed to proceed.    History of Present Illness: Susan Bentley is a 51 y.o. who identifies as a female who was assigned female at birth, and is being seen today for covid.  HPI: 51yo female with known hx of allergies presents due to concern of congestion since Monday. Took a home covid test Tuesday and Thursday, both negative. Took a test last night and it was positive. Has had covid before without any long term complications. Today is day 6 of symptoms. Has been using OTC steroid nasal spray with improvement. Denies cough or SOB. No fever. No CP or palps. Admits improvement to symptoms.   Problems:  Patient Active Problem List   Diagnosis Date Noted   Allergic rhinitis 12/27/2021   Tinnitus aurium, bilateral 12/27/2021   Arthralgia 11/27/2020   Family history of breast cancer 11/10/2020   Increased risk of breast cancer 11/10/2020   COVID-19 08/03/2020   Prediabetes 11/27/2019   Migraines 05/28/2019   Impingement syndrome of shoulder region 03/19/2019   Hypertriglyceridemia 10/17/2018   Elevated uric acid in blood 10/17/2018   Family history of colon cancer in father 10/17/2018   Encounter for general adult medical examination with abnormal findings 03/29/2013   GERD (gastroesophageal reflux disease) 03/29/2013   History of recurrent UTI (urinary tract infection) 03/29/2013   Headache  03/29/2013    Allergies:  Allergies  Allergen Reactions   Codeine Hives   Sulfa Antibiotics Other (See Comments)    Dizzy and lightheadedness   Medications:  Current Outpatient Medications:    levonorgestrel (MIRENA) 20 MCG/DAY IUD, 1 each by Intrauterine route once., Disp: , Rfl:    Loratadine 10 MG CAPS, Take 1 tablet by mouth daily., Disp: , Rfl:    Meth-Hyo-M Bl-Na  Phos-Ph Sal (URIBEL) 118 MG CAPS, Take 1 capsule (118 mg total) by mouth daily as needed., Disp: 90 capsule, Rfl: 3   nitrofurantoin, macrocrystal-monohydrate, (MACROBID) 100 MG capsule, take one capsule by mouth twice a day for 7 days (Patient not taking: Reported on 03/03/2022), Disp: 14 capsule, Rfl: 0   SUMAtriptan (IMITREX) 50 MG tablet, Take 1 tablet (50 mg total) by mouth once for 1 dose. May repeat in 2 hours if headache persists or recurs. No more than 2 doses in 24 hours., Disp: 9 tablet, Rfl: 2   triamcinolone (NASACORT) 55 MCG/ACT AERO nasal inhaler, Place 2 sprays into the nose daily., Disp: 16.9 mL, Rfl: 12  Observations/Objective: Patient is well-developed, well-nourished in no acute distress.  Resting comfortably at home. Non-toxic Head is normocephalic, atraumatic.  No labored breathing. No coughing Speech is clear and coherent with logical content.  Patient is alert and oriented at baseline.  No visible rhinorrhea No scleral injection  Assessment and Plan: 1. COVID-19  Pt is on day six of symptoms, and notes symptom improvement with her current OTC medication use. She is not a candidate for antiviral therapy due to timeframe from symptom onset and lack of risk factors. Supportive care discussed.   Follow Up Instructions: I discussed the assessment and treatment plan with the patient. The patient was provided an opportunity to ask questions and all were answered. The patient agreed with the plan and demonstrated an understanding of the instructions.  A copy of instructions were sent to the patient via MyChart unless otherwise noted below.    The patient was advised to call back or seek an in-person evaluation if the symptoms worsen or if the condition fails to improve as anticipated.  Time:  I spent 8 minutes with the patient via telehealth technology discussing the above problems/concerns.    [place], PA

## 2022-03-22 ENCOUNTER — Other Ambulatory Visit: Payer: Self-pay

## 2022-03-22 MED ORDER — NITROFURANTOIN MONOHYD MACRO 100 MG PO CAPS
100.0000 mg | ORAL_CAPSULE | Freq: Two times a day (BID) | ORAL | 0 refills | Status: DC
  Filled 2022-03-22: qty 14, 7d supply, fill #0

## 2022-03-30 ENCOUNTER — Other Ambulatory Visit: Payer: Self-pay

## 2022-03-30 MED ORDER — PROMETHAZINE-DM 6.25-15 MG/5ML PO SYRP
ORAL_SOLUTION | ORAL | 0 refills | Status: DC
  Filled 2022-03-30: qty 180, 9d supply, fill #0

## 2022-03-30 MED ORDER — PREDNISONE 10 MG PO TABS
ORAL_TABLET | ORAL | 0 refills | Status: DC
  Filled 2022-03-30: qty 21, 6d supply, fill #0

## 2022-03-30 MED ORDER — DOXYCYCLINE HYCLATE 100 MG PO TABS
100.0000 mg | ORAL_TABLET | Freq: Two times a day (BID) | ORAL | 0 refills | Status: AC
  Filled 2022-03-30: qty 20, 10d supply, fill #0

## 2022-03-31 ENCOUNTER — Other Ambulatory Visit: Payer: Self-pay

## 2022-03-31 DIAGNOSIS — R059 Cough, unspecified: Secondary | ICD-10-CM | POA: Diagnosis not present

## 2022-03-31 DIAGNOSIS — R053 Chronic cough: Secondary | ICD-10-CM | POA: Diagnosis not present

## 2022-03-31 DIAGNOSIS — R0600 Dyspnea, unspecified: Secondary | ICD-10-CM | POA: Diagnosis not present

## 2022-03-31 DIAGNOSIS — U071 COVID-19: Secondary | ICD-10-CM | POA: Diagnosis not present

## 2022-03-31 DIAGNOSIS — J209 Acute bronchitis, unspecified: Secondary | ICD-10-CM | POA: Diagnosis not present

## 2022-03-31 MED ORDER — AZITHROMYCIN 250 MG PO TABS
ORAL_TABLET | ORAL | 0 refills | Status: DC
  Filled 2022-03-31: qty 6, 5d supply, fill #0

## 2022-03-31 MED ORDER — ALBUTEROL SULFATE HFA 108 (90 BASE) MCG/ACT IN AERS
2.0000 | INHALATION_SPRAY | RESPIRATORY_TRACT | 0 refills | Status: DC | PRN
  Filled 2022-03-31: qty 6.7, 25d supply, fill #0

## 2022-03-31 MED ORDER — PROMETHAZINE-DM 6.25-15 MG/5ML PO SYRP
5.0000 mL | ORAL_SOLUTION | Freq: Four times a day (QID) | ORAL | 0 refills | Status: DC | PRN
  Filled 2022-03-31: qty 120, 6d supply, fill #0

## 2022-03-31 MED ORDER — PREDNISONE 10 MG PO TABS
ORAL_TABLET | ORAL | 0 refills | Status: DC
  Filled 2022-03-31: qty 21, 6d supply, fill #0

## 2022-04-05 DIAGNOSIS — H524 Presbyopia: Secondary | ICD-10-CM | POA: Diagnosis not present

## 2022-04-05 DIAGNOSIS — H5213 Myopia, bilateral: Secondary | ICD-10-CM | POA: Diagnosis not present

## 2022-04-05 DIAGNOSIS — H52223 Regular astigmatism, bilateral: Secondary | ICD-10-CM | POA: Diagnosis not present

## 2022-04-10 NOTE — Progress Notes (Unsigned)
   No chief complaint on file.    History of Present Illness:  Susan Bentley is a 52 y.o. that had a Mirena IUD REplaced approximately 5 weeks ago for menorrhagia. Since that time, she denies dyspareunia, pelvic pain, non-menstrual bleeding, vaginal d/c, heavy bleeding.   Review of Systems  Physical Exam:  There were no vitals taken for this visit. There is no height or weight on file to calculate BMI.  Pelvic exam:  Two IUD strings {DESC; PRESENT/ABSENT:17923} seen coming from the cervical os. EGBUS, vaginal vault and cervix: within normal limits   Assessment:   No diagnosis found.  IUD strings present in proper location; pt doing well  Plan: F/u if any signs of infection or can no longer feel the strings.   Hollie Bartus B. Fransisca Shawn, PA-C 04/10/2022 6:46 PM

## 2022-04-11 ENCOUNTER — Encounter: Payer: Self-pay | Admitting: Obstetrics and Gynecology

## 2022-04-11 ENCOUNTER — Ambulatory Visit: Payer: 59 | Admitting: Obstetrics and Gynecology

## 2022-04-11 VITALS — BP 123/81 | HR 114 | Ht 67.0 in | Wt 267.0 lb

## 2022-04-11 DIAGNOSIS — Z30431 Encounter for routine checking of intrauterine contraceptive device: Secondary | ICD-10-CM | POA: Diagnosis not present

## 2022-04-11 NOTE — Patient Instructions (Signed)
I value your feedback and you entrusting us with your care. If you get a [facility] patient survey, I would appreciate you taking the time to let us know about your experience today. Thank you! ? ? ?

## 2022-04-12 DIAGNOSIS — J301 Allergic rhinitis due to pollen: Secondary | ICD-10-CM | POA: Diagnosis not present

## 2022-04-12 DIAGNOSIS — H9313 Tinnitus, bilateral: Secondary | ICD-10-CM | POA: Diagnosis not present

## 2022-04-16 ENCOUNTER — Encounter: Payer: Self-pay | Admitting: Obstetrics and Gynecology

## 2022-04-16 DIAGNOSIS — Z803 Family history of malignant neoplasm of breast: Secondary | ICD-10-CM

## 2022-04-16 DIAGNOSIS — Z1239 Encounter for other screening for malignant neoplasm of breast: Secondary | ICD-10-CM

## 2022-04-16 DIAGNOSIS — Z9189 Other specified personal risk factors, not elsewhere classified: Secondary | ICD-10-CM

## 2022-05-05 ENCOUNTER — Ambulatory Visit
Admission: RE | Admit: 2022-05-05 | Discharge: 2022-05-05 | Disposition: A | Discharge status: Home / Self Care | Payer: 59 | Source: Ambulatory Visit | Attending: Obstetrics and Gynecology | Admitting: Obstetrics and Gynecology

## 2022-05-05 DIAGNOSIS — Z9189 Other specified personal risk factors, not elsewhere classified: Secondary | ICD-10-CM

## 2022-05-05 DIAGNOSIS — Z1239 Encounter for other screening for malignant neoplasm of breast: Secondary | ICD-10-CM | POA: Diagnosis not present

## 2022-05-05 DIAGNOSIS — Z803 Family history of malignant neoplasm of breast: Secondary | ICD-10-CM

## 2022-05-05 MED ORDER — GADOPICLENOL 0.5 MMOL/ML IV SOLN
10.0000 mL | Freq: Once | INTRAVENOUS | Status: AC | PRN
  Administered 2022-05-05: 10 mL via INTRAVENOUS

## 2022-06-20 ENCOUNTER — Other Ambulatory Visit: Payer: Self-pay | Admitting: Obstetrics and Gynecology

## 2022-06-21 ENCOUNTER — Other Ambulatory Visit: Payer: Self-pay

## 2022-06-21 MED ORDER — NITROFURANTOIN MONOHYD MACRO 100 MG PO CAPS
ORAL_CAPSULE | ORAL | 0 refills | Status: DC
  Filled 2022-06-21: qty 30, 30d supply, fill #0

## 2022-07-25 DIAGNOSIS — M7542 Impingement syndrome of left shoulder: Secondary | ICD-10-CM | POA: Diagnosis not present

## 2022-09-01 ENCOUNTER — Other Ambulatory Visit: Payer: Self-pay

## 2022-09-01 DIAGNOSIS — D1721 Benign lipomatous neoplasm of skin and subcutaneous tissue of right arm: Secondary | ICD-10-CM | POA: Diagnosis not present

## 2022-09-01 DIAGNOSIS — L700 Acne vulgaris: Secondary | ICD-10-CM | POA: Diagnosis not present

## 2022-09-01 DIAGNOSIS — D2261 Melanocytic nevi of right upper limb, including shoulder: Secondary | ICD-10-CM | POA: Diagnosis not present

## 2022-09-01 DIAGNOSIS — D2262 Melanocytic nevi of left upper limb, including shoulder: Secondary | ICD-10-CM | POA: Diagnosis not present

## 2022-09-01 DIAGNOSIS — L578 Other skin changes due to chronic exposure to nonionizing radiation: Secondary | ICD-10-CM | POA: Diagnosis not present

## 2022-09-01 DIAGNOSIS — D2272 Melanocytic nevi of left lower limb, including hip: Secondary | ICD-10-CM | POA: Diagnosis not present

## 2022-09-01 DIAGNOSIS — D225 Melanocytic nevi of trunk: Secondary | ICD-10-CM | POA: Diagnosis not present

## 2022-09-01 DIAGNOSIS — D2271 Melanocytic nevi of right lower limb, including hip: Secondary | ICD-10-CM | POA: Diagnosis not present

## 2022-09-01 MED ORDER — TRETINOIN 0.025 % EX CREA
1.0000 | TOPICAL_CREAM | CUTANEOUS | 2 refills | Status: DC
  Filled 2022-09-01: qty 20, 20d supply, fill #0

## 2022-09-27 ENCOUNTER — Other Ambulatory Visit: Payer: Self-pay | Admitting: Family Medicine

## 2022-09-27 DIAGNOSIS — Z1231 Encounter for screening mammogram for malignant neoplasm of breast: Secondary | ICD-10-CM

## 2022-11-11 ENCOUNTER — Ambulatory Visit: Payer: 59

## 2022-11-18 ENCOUNTER — Ambulatory Visit: Admission: RE | Admit: 2022-11-18 | Payer: 59 | Source: Ambulatory Visit

## 2022-11-18 DIAGNOSIS — Z1231 Encounter for screening mammogram for malignant neoplasm of breast: Secondary | ICD-10-CM

## 2022-11-21 DIAGNOSIS — M7542 Impingement syndrome of left shoulder: Secondary | ICD-10-CM | POA: Diagnosis not present

## 2022-11-21 DIAGNOSIS — M25512 Pain in left shoulder: Secondary | ICD-10-CM | POA: Diagnosis not present

## 2022-11-24 ENCOUNTER — Other Ambulatory Visit: Payer: Self-pay | Admitting: Oncology

## 2022-11-24 DIAGNOSIS — M7542 Impingement syndrome of left shoulder: Secondary | ICD-10-CM | POA: Diagnosis not present

## 2022-11-24 DIAGNOSIS — Z006 Encounter for examination for normal comparison and control in clinical research program: Secondary | ICD-10-CM

## 2022-11-24 DIAGNOSIS — M25512 Pain in left shoulder: Secondary | ICD-10-CM | POA: Diagnosis not present

## 2022-12-12 ENCOUNTER — Other Ambulatory Visit: Payer: Self-pay | Admitting: Obstetrics and Gynecology

## 2022-12-12 ENCOUNTER — Other Ambulatory Visit: Payer: Self-pay

## 2022-12-12 DIAGNOSIS — M7542 Impingement syndrome of left shoulder: Secondary | ICD-10-CM | POA: Diagnosis not present

## 2022-12-19 ENCOUNTER — Other Ambulatory Visit: Payer: Self-pay

## 2022-12-20 ENCOUNTER — Other Ambulatory Visit: Payer: Self-pay

## 2022-12-20 MED ORDER — COMIRNATY 30 MCG/0.3ML IM SUSY
PREFILLED_SYRINGE | INTRAMUSCULAR | 0 refills | Status: DC
  Filled 2022-12-20: qty 0.3, 1d supply, fill #0

## 2022-12-21 ENCOUNTER — Ambulatory Visit: Payer: No Typology Code available for payment source | Admitting: Dermatology

## 2022-12-29 ENCOUNTER — Encounter: Payer: Self-pay | Admitting: Family Medicine

## 2022-12-30 ENCOUNTER — Encounter: Payer: 59 | Admitting: Family Medicine

## 2023-01-04 ENCOUNTER — Ambulatory Visit (INDEPENDENT_AMBULATORY_CARE_PROVIDER_SITE_OTHER): Payer: 59 | Admitting: Family Medicine

## 2023-01-04 ENCOUNTER — Other Ambulatory Visit: Payer: Self-pay

## 2023-01-04 ENCOUNTER — Encounter: Payer: Self-pay | Admitting: Family Medicine

## 2023-01-04 VITALS — BP 126/82 | HR 69 | Temp 97.5°F | Ht 67.0 in | Wt 254.2 lb

## 2023-01-04 DIAGNOSIS — H9319 Tinnitus, unspecified ear: Secondary | ICD-10-CM | POA: Insufficient documentation

## 2023-01-04 DIAGNOSIS — Z130 Encounter for screening for diseases of the blood and blood-forming organs and certain disorders involving the immune mechanism: Secondary | ICD-10-CM

## 2023-01-04 DIAGNOSIS — E781 Pure hyperglyceridemia: Secondary | ICD-10-CM | POA: Diagnosis not present

## 2023-01-04 DIAGNOSIS — G43009 Migraine without aura, not intractable, without status migrainosus: Secondary | ICD-10-CM | POA: Diagnosis not present

## 2023-01-04 DIAGNOSIS — Z87440 Personal history of urinary (tract) infections: Secondary | ICD-10-CM | POA: Diagnosis not present

## 2023-01-04 DIAGNOSIS — M7542 Impingement syndrome of left shoulder: Secondary | ICD-10-CM

## 2023-01-04 DIAGNOSIS — R7303 Prediabetes: Secondary | ICD-10-CM | POA: Diagnosis not present

## 2023-01-04 DIAGNOSIS — Z1329 Encounter for screening for other suspected endocrine disorder: Secondary | ICD-10-CM

## 2023-01-04 DIAGNOSIS — H9311 Tinnitus, right ear: Secondary | ICD-10-CM | POA: Diagnosis not present

## 2023-01-04 DIAGNOSIS — Z0001 Encounter for general adult medical examination with abnormal findings: Secondary | ICD-10-CM

## 2023-01-04 LAB — LIPID PANEL
Cholesterol: 198 mg/dL (ref 0–200)
HDL: 34.5 mg/dL — ABNORMAL LOW (ref >39.00)
LDL Cholesterol: 124 mg/dL — ABNORMAL HIGH (ref 0–99)
NonHDL: 163.75
Total CHOL/HDL Ratio: 6
Triglycerides: 199 mg/dL — ABNORMAL HIGH (ref 0.0–149.0)
VLDL: 39.8 mg/dL (ref 0.0–40.0)

## 2023-01-04 LAB — CBC
HCT: 39.4 % (ref 36.0–46.0)
Hemoglobin: 12.5 g/dL (ref 12.0–15.0)
MCHC: 31.8 g/dL (ref 30.0–36.0)
MCV: 81.6 fL (ref 78.0–100.0)
Platelets: 401 10*3/uL — ABNORMAL HIGH (ref 150.0–400.0)
RBC: 4.83 Mil/uL (ref 3.87–5.11)
RDW: 14.1 % (ref 11.5–15.5)
WBC: 7 10*3/uL (ref 4.0–10.5)

## 2023-01-04 LAB — TSH: TSH: 1.98 u[IU]/mL (ref 0.35–5.50)

## 2023-01-04 LAB — COMPREHENSIVE METABOLIC PANEL
ALT: 16 U/L (ref 0–35)
AST: 18 U/L (ref 0–37)
Albumin: 3.9 g/dL (ref 3.5–5.2)
Alkaline Phosphatase: 71 U/L (ref 39–117)
BUN: 12 mg/dL (ref 6–23)
CO2: 28 meq/L (ref 19–32)
Calcium: 9.4 mg/dL (ref 8.4–10.5)
Chloride: 102 meq/L (ref 96–112)
Creatinine, Ser: 0.8 mg/dL (ref 0.40–1.20)
GFR: 84.25 mL/min (ref >60.00)
Glucose, Bld: 86 mg/dL (ref 70–99)
Potassium: 4.1 meq/L (ref 3.5–5.1)
Sodium: 138 meq/L (ref 135–145)
Total Bilirubin: 0.4 mg/dL (ref 0.2–1.2)
Total Protein: 6.5 g/dL (ref 6.0–8.3)

## 2023-01-04 LAB — HEMOGLOBIN A1C: Hgb A1c MFr Bld: 6.1 % (ref 4.6–6.5)

## 2023-01-04 MED ORDER — SUMATRIPTAN SUCCINATE 50 MG PO TABS
50.0000 mg | ORAL_TABLET | Freq: Once | ORAL | 1 refills | Status: DC
  Filled 2023-01-04: qty 9, 15d supply, fill #0

## 2023-01-04 MED ORDER — NITROFURANTOIN MONOHYD MACRO 100 MG PO CAPS
100.0000 mg | ORAL_CAPSULE | ORAL | 0 refills | Status: DC
  Filled 2023-01-04: qty 30, 30d supply, fill #0

## 2023-01-04 NOTE — Progress Notes (Signed)
Marikay Alar, MD Phone: [phone]  Susan Bentley is a 52 y.o. female who presents today for CPE.  Diet: Patient has been working on portion sizes.  She is eating more salads and grilled chicken, occasionally will have a biscuit on the weekend, she has been cutting back on fried foods, she has cut back on sweet tea to 2 times a week Exercise: Doing some exercise bands and walking 2-3 times a week Pap smear: 11/10/2020 negative HPV and NILM Colonoscopy: 02/11/24 year recall given family history Mammogram: 11/18/2022 negative Family history-  Colon cancer: father  Breast cancer: mother  Ovarian cancer: no Menses: IUD Vaccines-   Flu: through work  Tetanus: UTD  Shingles: UTD  COVID19: UTD HIV screening: UTD Hep C Screening: UTD Tobacco use: no Alcohol use: no Illicit Drug use: no Dentist: yes Ophthalmology: yes  Patient has chronic tinnitus in her right ear.  She reports she saw ENT and they noted she had some hearing loss.  They wondered if it was related to the Shingrix vaccine and they advised her not to get any more shingles vaccines though she had already completed the vaccine course.  Patient is scheduled for left shoulder surgery in December.   Active Ambulatory Problems    Diagnosis Date Noted   Encounter for general adult medical examination with abnormal findings 03/29/2013   GERD (gastroesophageal reflux disease) 03/29/2013   History of recurrent UTI (urinary tract infection) 03/29/2013   Headache 03/29/2013   Hypertriglyceridemia 10/17/2018   Elevated uric acid in blood 10/17/2018   Family history of colon cancer in father 10/17/2018   Migraines 05/28/2019   Prediabetes 11/27/2019   COVID-19 08/03/2020   Family history of breast cancer 11/10/2020   Increased risk of breast cancer 11/10/2020   Arthralgia 11/27/2020   Impingement syndrome of shoulder region 03/19/2019   Allergic rhinitis 12/27/2021   Tinnitus aurium, bilateral 12/27/2021   Tinnitus  01/04/2023   Resolved Ambulatory Problems    Diagnosis Date Noted   No Resolved Ambulatory Problems   Past Medical History:  Diagnosis Date   Allergy    BRCA negative 10/2019   History of abnormal cervical Pap smear    History of mammogram 10/13/2014   History of Papanicolaou smear of cervix 10/13/2014   Migraine    UTI (lower urinary tract infection)     Family History  Problem Relation Age of Onset   Cancer Maternal Grandmother 15       colon cancer, breast cancer, brain cancer   Hyperlipidemia Paternal Grandmother    Hypertension Paternal Grandmother    Heart disease Paternal Grandmother 50   Cancer Paternal Grandmother 24       leukemia   Hyperlipidemia Paternal Grandfather    Stroke Paternal Grandfather    Hypertension Paternal Grandfather    Aneurysm Paternal Grandfather    Heart disease Paternal Grandfather    Hypothyroidism Father    Hypothyroidism Sister    Breast cancer Mother 39   Heart disease Maternal Grandfather 14   Cancer Other 20       colon    Social History   Socioeconomic History   Marital status: Married    Spouse name: [name]   Number of children: 2   Years of education: 16   Highest education level: Bachelor's degree (e.g., BA, AB, BS)  Occupational History   Occupation: Charity fundraiser - Occupational Health    Comment: [name]  Tobacco Use   Smoking status: Never   Smokeless tobacco: Never  Vaping  Use   Vaping status: Never Used  Substance and Sexual Activity   Alcohol use: Yes    Alcohol/week: 0.0 standard drinks of alcohol    Comment: occ   Drug use: No   Sexual activity: Yes    Birth control/protection: I.U.D.    Comment: mirena  Other Topics Concern   Not on file  Social History Narrative   Aggie grew up in [place]. She lives at home with her husband, Gerlene Burdock, of 3 years. Her youngest son, Eliberto Ivory, is a Holiday representative in [employer] and lives at home with them. Britzy has another son, Gerre Pebbles, who is away at college at [employer]. Rheana  attended [employer] and obtained her Bachelors in Nursing. 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 She enjoys travel as much as able. She also enjoys gardening in the summer. She also quilts.   Social Determinants of Health   Financial Resource Strain: Low Risk  (01/02/2023)   Overall Financial Resource Strain (CARDIA)    Difficulty of Paying Living Expenses: Not very hard  Food Insecurity: Unknown (01/02/2023)   Hunger Vital Sign    Worried About Running Out of Food in the Last Year: Never true    Ran Out of Food in the Last Year: Patient declined  Transportation Needs: No Transportation Needs (01/02/2023)   PRAPARE - Administrator, Civil Service (Medical): No    Lack of Transportation (Non-Medical): No  Physical Activity: Unknown (01/02/2023)   Exercise Vital Sign    Days of Exercise per Week: 0 days    Minutes of Exercise per Session: Not on file  Stress: No Stress Concern Present (01/02/2023)   [employer] of Occupational Health - Occupational Stress Questionnaire    Feeling of Stress : Only a little  Social Connections: Moderately Integrated (01/02/2023)   Social Connection and Isolation Panel [NHANES]    Frequency of Communication with Friends and Family: More than three times a week    Frequency of Social Gatherings with Friends and Family: Once a week    Attends Religious Services: 1 to 4 times per year    Active Member of [employer] or Organizations: No    Attends Engineer, structural: Not on file    Marital Status: Married  Catering manager Violence: Not on file    ROS  General:  Negative for nexplained weight loss, fever Skin: Negative for new or changing mole, sore that won't heal HEENT: Negative for trouble hearing, trouble seeing, ringing in ears, mouth sores, hoarseness, change in voice, dysphagia. CV:  Negative for chest pain, dyspnea, edema, palpitations Resp: Negative for cough, dyspnea, hemoptysis GI: Negative for nausea, vomiting,  diarrhea, constipation, abdominal pain, melena, hematochezia. GU: Negative for dysuria, incontinence, urinary hesitance, hematuria, vaginal or penile discharge, polyuria, sexual difficulty, lumps in testicle or breasts MSK: Negative for muscle cramps or aches, joint pain or swelling Neuro: Negative for headaches, weakness, numbness, dizziness, passing out/fainting Psych: Negative for depression, anxiety, memory problems  Objective  Physical Exam Vitals:   01/04/23 1144  BP: 126/82  Pulse: 69  Temp: (!) 97.5 F (36.4 C)  SpO2: 99%    BP Readings from Last 3 Encounters:  01/04/23 126/82  04/11/22 123/81  03/03/22 122/80   Wt Readings from Last 3 Encounters:  01/04/23 254 lb 3.2 oz (115.3 kg)  04/11/22 267 lb (121.1 kg)  03/03/22 271 lb 6.4 oz (123.1 kg)    Physical Exam Constitutional:      General: She is not in acute  distress.    Appearance: She is not diaphoretic.  HENT:     Head: Normocephalic and atraumatic.  Cardiovascular:     Rate and Rhythm: Normal rate and regular rhythm.     Heart sounds: Normal heart sounds.  Pulmonary:     Effort: Pulmonary effort is normal.     Breath sounds: Normal breath sounds.  Abdominal:     General: Bowel sounds are normal. There is no distension.     Palpations: Abdomen is soft.     Tenderness: There is no abdominal tenderness.  Musculoskeletal:     Right lower leg: No edema.     Left lower leg: No edema.  Lymphadenopathy:     Cervical: No cervical adenopathy.  Skin:    General: Skin is warm and dry.  Neurological:     Mental Status: She is alert.  Psychiatric:        Mood and Affect: Mood normal.      Assessment/Plan:   Encounter for general adult medical examination with abnormal findings Assessment & Plan: Physical exam completed.  Encouraged continued diet changes and increasing her exercise.  Cancer screening is up-to-date.  Patient will get her flu vaccine through work.  Lab work as outlined.   Migraine  without aura and without status migrainosus, not intractable -     SUMAtriptan Succinate; Take 1 tablet (50 mg total) by mouth once for 1 dose. May repeat in 2 hours if headache persists or recurs. No more than 2 doses in 24 hours.  Dispense: 9 tablet; Refill: 1  History of recurrent UTI (urinary tract infection) -     Nitrofurantoin Monohyd Macro; Take 1 capsule (100 mg total) by mouth after sex as preventative  Dispense: 30 capsule; Refill: 0  Hypertriglyceridemia -     Comprehensive metabolic panel -     Lipid panel  Prediabetes -     Hemoglobin A1c  Thyroid disorder screen -     TSH  Screening for deficiency anemia -     CBC  Tinnitus of right ear Assessment & Plan: Chronic issue.  Patient has seen ENT for this.  She will monitor.   Impingement syndrome of left shoulder Assessment & Plan: Patient will have surgery as planned.     Return in about 1 year (around 01/04/2024) for CPE with new provider.   Marikay Alar, MD [facility]

## 2023-01-04 NOTE — Assessment & Plan Note (Signed)
Patient will have surgery as planned.

## 2023-01-04 NOTE — Assessment & Plan Note (Signed)
Physical exam completed.  Encouraged continued diet changes and increasing her exercise.  Cancer screening is up-to-date.  Patient will get her flu vaccine through work.  Lab work as outlined.

## 2023-01-04 NOTE — Assessment & Plan Note (Signed)
Chronic issue.  Patient has seen ENT for this.  She will monitor.

## 2023-01-06 ENCOUNTER — Encounter: Payer: Self-pay | Admitting: Family Medicine

## 2023-01-06 NOTE — Telephone Encounter (Signed)
Pt specifically asked about her platelets.  Please address in result note.  Thank you!

## 2023-01-06 NOTE — Telephone Encounter (Signed)
See MyChart result message sent to patient.

## 2023-02-13 ENCOUNTER — Other Ambulatory Visit: Payer: Self-pay

## 2023-02-13 DIAGNOSIS — M7542 Impingement syndrome of left shoulder: Secondary | ICD-10-CM | POA: Diagnosis not present

## 2023-02-13 DIAGNOSIS — M25512 Pain in left shoulder: Secondary | ICD-10-CM | POA: Diagnosis not present

## 2023-02-13 MED ORDER — ONDANSETRON HCL 4 MG PO TABS
4.0000 mg | ORAL_TABLET | Freq: Three times a day (TID) | ORAL | 0 refills | Status: DC | PRN
  Filled 2023-02-13: qty 12, 4d supply, fill #0

## 2023-02-13 MED ORDER — CELECOXIB 200 MG PO CAPS
200.0000 mg | ORAL_CAPSULE | Freq: Two times a day (BID) | ORAL | 0 refills | Status: DC
  Filled 2023-02-13: qty 28, 14d supply, fill #0

## 2023-02-13 MED ORDER — HYDROCODONE-ACETAMINOPHEN 5-325 MG PO TABS
1.0000 | ORAL_TABLET | ORAL | 0 refills | Status: DC
  Filled 2023-02-13: qty 30, 5d supply, fill #0

## 2023-02-13 MED ORDER — ASPIRIN 81 MG PO TBEC
81.0000 mg | DELAYED_RELEASE_TABLET | Freq: Two times a day (BID) | ORAL | 0 refills | Status: DC
  Filled 2023-02-13: qty 28, 14d supply, fill #0

## 2023-03-14 ENCOUNTER — Other Ambulatory Visit
Admission: RE | Admit: 2023-03-14 | Discharge: 2023-03-14 | Disposition: A | Discharge status: Home / Self Care | Payer: Self-pay | Source: Ambulatory Visit | Attending: Oncology | Admitting: Oncology

## 2023-03-14 DIAGNOSIS — Z006 Encounter for examination for normal comparison and control in clinical research program: Secondary | ICD-10-CM | POA: Insufficient documentation

## 2023-03-24 DIAGNOSIS — M25812 Other specified joint disorders, left shoulder: Secondary | ICD-10-CM | POA: Diagnosis not present

## 2023-03-24 DIAGNOSIS — M25512 Pain in left shoulder: Secondary | ICD-10-CM | POA: Diagnosis not present

## 2023-03-24 DIAGNOSIS — M75112 Incomplete rotator cuff tear or rupture of left shoulder, not specified as traumatic: Secondary | ICD-10-CM | POA: Diagnosis not present

## 2023-03-24 DIAGNOSIS — G8929 Other chronic pain: Secondary | ICD-10-CM | POA: Diagnosis not present

## 2023-03-24 DIAGNOSIS — M7522 Bicipital tendinitis, left shoulder: Secondary | ICD-10-CM | POA: Diagnosis not present

## 2023-03-27 LAB — GENECONNECT MOLECULAR SCREEN: Genetic Analysis Overall Interpretation: NEGATIVE

## 2023-04-11 ENCOUNTER — Other Ambulatory Visit: Payer: Self-pay | Admitting: Surgery

## 2023-04-13 ENCOUNTER — Encounter
Admission: RE | Admit: 2023-04-13 | Discharge: 2023-04-13 | Disposition: A | Discharge status: Home / Self Care | Payer: Commercial Managed Care - PPO | Source: Ambulatory Visit | Attending: Surgery | Admitting: Surgery

## 2023-04-13 HISTORY — DX: COVID-19: U07.1

## 2023-04-13 HISTORY — DX: Prediabetes: R73.03

## 2023-04-13 HISTORY — DX: Personal history of urinary (tract) infections: Z87.440

## 2023-04-13 HISTORY — DX: Hyperuricemia without signs of inflammatory arthritis and tophaceous disease: E79.0

## 2023-04-13 HISTORY — DX: Gastro-esophageal reflux disease without esophagitis: K21.9

## 2023-04-13 HISTORY — DX: Impingement syndrome of unspecified shoulder: M75.40

## 2023-04-13 HISTORY — DX: Pure hyperglyceridemia: E78.1

## 2023-04-13 NOTE — Patient Instructions (Signed)
Your procedure is scheduled on:04-18-23 Tuesday Report to the Registration Desk on the 1st floor of the [facility].Then proceed to the 2nd floor Surgery Desk To find out your arrival time, please call [phone] between 1PM - 3PM on:04-17-23 Monday If your arrival time is 6:00 am, do not arrive before that time as the [facility] entrance doors do not open until 6:00 am.  REMEMBER: Instructions that are not followed completely may result in serious medical risk, up to and including death; or upon the discretion of your surgeon and anesthesiologist your surgery may need to be rescheduled.  Do not eat food after midnight the night before surgery.  No gum chewing or hard candies.  You may however, drink CLEAR liquids up to 2 hours before you are scheduled to arrive for your surgery. Do not drink anything within 2 hours of your scheduled arrival time.  Clear liquids include: - water  - apple juice without pulp - gatorade (not RED colors) - black coffee or tea (Do NOT add milk or creamers to the coffee or tea) Do NOT drink anything that is not on this list.  In addition, your doctor has ordered for you to drink the provided:  Ensure Pre-Surgery Clear Carbohydrate Drink  Drinking this carbohydrate drink up to two hours before surgery helps to reduce insulin resistance and improve patient outcomes. Please complete drinking 2 hours before scheduled arrival time.  One week prior to surgery:Stop NOW (04-13-23) Stop Anti-inflammatories (NSAIDS) such as Advil, Aleve, Ibuprofen, Motrin, Naproxen, Naprosyn and Aspirin based products such as Excedrin, Goody's Powder, BC Powder. Stop ANY OVER THE COUNTER supplements until after surgery (CoQ-10, Multivitamin, Vitamin D)  You may however, continue to take Tylenol if needed for pain up until the day of surgery.  Continue taking all of your other prescription medications up until the day of surgery.  ON THE DAY OF SURGERY ONLY TAKE THESE MEDICATIONS  WITH SIPS OF WATER: -Loratadine   No Alcohol for 24 hours before or after surgery.  No Smoking including e-cigarettes for 24 hours before surgery.  No chewable tobacco products for at least 6 hours before surgery.  No nicotine patches on the day of surgery.  Do not use any "recreational" drugs for at least a week (preferably 2 weeks) before your surgery.  Please be advised that the combination of cocaine and anesthesia may have negative outcomes, up to and including death. If you test positive for cocaine, your surgery will be cancelled.  On the morning of surgery brush your teeth with toothpaste and water, you may rinse your mouth with mouthwash if you wish. Do not swallow any toothpaste or mouthwash.  Use CHG Soap as directed on instruction sheet.  Do not wear jewelry, make-up, hairpins, clips or nail polish.  For welded (permanent) jewelry: bracelets, anklets, waist bands, etc.  Please have this removed prior to surgery.  If it is not removed, there is a chance that hospital personnel will need to cut it off on the day of surgery.  Do not wear lotions, powders, or perfumes.   Do not shave body hair from the neck down 48 hours before surgery.  Contact lenses, hearing aids and dentures may not be worn into surgery.  Do not bring valuables to the hospital. [facility] is not responsible for any missing/lost belongings or valuables.    Notify your doctor if there is any change in your medical condition (cold, fever, infection).  Wear comfortable clothing (specific to your surgery type)  to the hospital.  After surgery, you can help prevent lung complications by doing breathing exercises.  Take deep breaths and cough every 1-2 hours. Your doctor may order a device called an Incentive Spirometer to help you take deep breaths. When coughing or sneezing, hold a pillow firmly against your incision with both hands. This is called "splinting." Doing this helps protect your incision. It  also decreases belly discomfort.  If you are being admitted to the hospital overnight, leave your suitcase in the car. After surgery it may be brought to your room.  In case of increased patient census, it may be necessary for you, the patient, to continue your postoperative care in the Same Day Surgery department.  If you are being discharged the day of surgery, you will not be allowed to drive home. You will need a responsible individual to drive you home and stay with you for 24 hours after surgery.   If you are taking public transportation, you will need to have a responsible individual with you.  Please call the Pre-admissions Testing Dept. at [phone] if you have any questions about these instructions.  Surgery Visitation Policy:  Patients having surgery or a procedure may have two visitors.  Children under the age of 13 must have an adult with them who is not the patient.  Temporary Visitor Restrictions Due to increasing cases of flu, RSV and COVID-19: Children ages 33 and under will not be able to visit patients in [facility] hospitals under most circumstances.     Preparing for Surgery with CHLORHEXIDINE GLUCONATE (CHG) Soap  Chlorhexidine Gluconate (CHG) Soap  o An antiseptic cleaner that kills germs and bonds with the skin to continue killing germs even after washing  o Used for showering the night before surgery and morning of surgery  Before surgery, you can play an important role by reducing the number of germs on your skin.  CHG (Chlorhexidine gluconate) soap is an antiseptic cleanser which kills germs and bonds with the skin to continue killing germs even after washing.  Please do not use if you have an allergy to CHG or antibacterial soaps. If your skin becomes reddened/irritated stop using the CHG.  1. Shower the NIGHT BEFORE SURGERY and the MORNING OF SURGERY with CHG soap.  2. If you choose to wash your hair, wash your hair first as usual with your  normal shampoo.  3. After shampooing, rinse your hair and body thoroughly to remove the shampoo.  4. Use CHG as you would any other liquid soap. You can apply CHG directly to the skin and wash gently with a scrungie or a clean washcloth.  5. Apply the CHG soap to your body only from the neck down. Do not use on open wounds or open sores. Avoid contact with your eyes, ears, mouth, and genitals (private parts). Wash face and genitals (private parts) with your normal soap.  6. Wash thoroughly, paying special attention to the area where your surgery will be performed.  7. Thoroughly rinse your body with warm water.  8. Do not shower/wash with your normal soap after using and rinsing off the CHG soap.  9. Pat yourself dry with a clean towel.  10. Wear clean pajamas to bed the night before surgery.  12. Place clean sheets on your bed the night of your first shower and do not sleep with pets.  13. Shower again with the CHG soap on the day of surgery prior to arriving at the hospital.  14. Do not apply any deodorants/lotions/powders.  15. Please wear clean clothes to the hospital.  How to Use an Incentive Spirometer An incentive spirometer is a tool that measures how well you are filling your lungs with each breath. Learning to take long, deep breaths using this tool can help you keep your lungs clear and active. This may help to reverse or lessen your chance of developing breathing (pulmonary) problems, especially infection. You may be asked to use a spirometer: After a surgery. If you have a lung problem or a history of smoking. After a long period of time when you have been unable to move or be active. If the spirometer includes an indicator to show the highest number that you have reached, your health care provider or respiratory therapist will help you set a goal. Keep a log of your progress as told by your health care provider. What are the risks? Breathing too quickly may cause  dizziness or cause you to pass out. Take your time so you do not get dizzy or light-headed. If you are in pain, you may need to take pain medicine before doing incentive spirometry. It is harder to take a deep breath if you are having pain. How to use your incentive spirometer  Sit up on the edge of your bed or on a chair. Hold the incentive spirometer so that it is in an upright position. Before you use the spirometer, breathe out normally. Place the mouthpiece in your mouth. Make sure your lips are closed tightly around it. Breathe in slowly and as deeply as you can through your mouth, causing the piston or the ball to rise toward the top of the chamber. Hold your breath for 3-5 seconds, or for as long as possible. If the spirometer includes a coach indicator, use this to guide you in breathing. Slow down your breathing if the indicator goes above the marked areas. Remove the mouthpiece from your mouth and breathe out normally. The piston or ball will return to the bottom of the chamber. Rest for a few seconds, then repeat the steps 10 or more times. Take your time and take a few normal breaths between deep breaths so that you do not get dizzy or light-headed. Do this every 1-2 hours when you are awake. If the spirometer includes a goal marker to show the highest number you have reached (best effort), use this as a goal to work toward during each repetition. After each set of 10 deep breaths, cough a few times. This will help to make sure that your lungs are clear. If you have an incision on your chest or abdomen from surgery, place a pillow or a rolled-up towel firmly against the incision when you cough. This can help to reduce pain while taking deep breaths and coughing. General tips When you are able to get out of bed: Walk around often. Continue to take deep breaths and cough in order to clear your lungs. Keep using the incentive spirometer until your health care provider says it is okay  to stop using it. If you have been in the hospital, you may be told to keep using the spirometer at home. Contact a health care provider if: You are having difficulty using the spirometer. You have trouble using the spirometer as often as instructed. Your pain medicine is not giving enough relief for you to use the spirometer as told. You have a fever. Get help right away if: You develop shortness of breath. You develop  a cough with bloody mucus from the lungs. You have fluid or blood coming from an incision site after you cough. Summary An incentive spirometer is a tool that can help you learn to take long, deep breaths to keep your lungs clear and active. You may be asked to use a spirometer after a surgery, if you have a lung problem or a history of smoking, or if you have been inactive for a long period of time. Use your incentive spirometer as instructed every 1-2 hours while you are awake. If you have an incision on your chest or abdomen, place a pillow or a rolled-up towel firmly against your incision when you cough. This will help to reduce pain. Get help right away if you have shortness of breath, you cough up bloody mucus, or blood comes from your incision when you cough. This information is not intended to replace advice given to you by your health care provider. Make sure you discuss any questions you have with your health care provider. Document Revised: 06/03/2019 Document Reviewed: 06/03/2019 Elsevier Patient Education  2024 [employer].

## 2023-04-18 ENCOUNTER — Ambulatory Visit: Payer: Commercial Managed Care - PPO | Admitting: Urgent Care

## 2023-04-18 ENCOUNTER — Ambulatory Visit
Admission: RE | Admit: 2023-04-18 | Discharge: 2023-04-18 | Disposition: A | Discharge status: Home / Self Care | Payer: Commercial Managed Care - PPO | Attending: Surgery | Admitting: Surgery

## 2023-04-18 ENCOUNTER — Ambulatory Visit: Payer: Commercial Managed Care - PPO

## 2023-04-18 ENCOUNTER — Encounter
Admission: RE | Disposition: A | Discharge status: Home / Self Care | Payer: Self-pay | Source: Home / Self Care | Attending: Surgery

## 2023-04-18 ENCOUNTER — Ambulatory Visit: Payer: Commercial Managed Care - PPO | Admitting: Anesthesiology

## 2023-04-18 ENCOUNTER — Other Ambulatory Visit: Payer: Self-pay

## 2023-04-18 ENCOUNTER — Encounter: Payer: Self-pay | Admitting: Surgery

## 2023-04-18 DIAGNOSIS — G8918 Other acute postprocedural pain: Secondary | ICD-10-CM | POA: Diagnosis not present

## 2023-04-18 DIAGNOSIS — K219 Gastro-esophageal reflux disease without esophagitis: Secondary | ICD-10-CM | POA: Diagnosis not present

## 2023-04-18 DIAGNOSIS — M75112 Incomplete rotator cuff tear or rupture of left shoulder, not specified as traumatic: Secondary | ICD-10-CM | POA: Insufficient documentation

## 2023-04-18 DIAGNOSIS — M19012 Primary osteoarthritis, left shoulder: Secondary | ICD-10-CM | POA: Diagnosis not present

## 2023-04-18 DIAGNOSIS — M7522 Bicipital tendinitis, left shoulder: Secondary | ICD-10-CM | POA: Diagnosis not present

## 2023-04-18 DIAGNOSIS — M25812 Other specified joint disorders, left shoulder: Secondary | ICD-10-CM | POA: Insufficient documentation

## 2023-04-18 HISTORY — PX: SHOULDER ARTHROSCOPY WITH SUBACROMIAL DECOMPRESSION, ROTATOR CUFF REPAIR AND BICEP TENDON REPAIR: SHX5687

## 2023-04-18 SURGERY — SHOULDER ARTHROSCOPY WITH SUBACROMIAL DECOMPRESSION, ROTATOR CUFF REPAIR AND BICEP TENDON REPAIR
Anesthesia: Regional | Site: Shoulder | Laterality: Left

## 2023-04-18 MED ORDER — KETOROLAC TROMETHAMINE 30 MG/ML IJ SOLN
30.0000 mg | Freq: Once | INTRAMUSCULAR | Status: AC
  Administered 2023-04-18: 30 mg via INTRAVENOUS

## 2023-04-18 MED ORDER — PHENYLEPHRINE HCL-NACL 20-0.9 MG/250ML-% IV SOLN
INTRAVENOUS | Status: DC | PRN
  Administered 2023-04-18: 30 ug/min via INTRAVENOUS

## 2023-04-18 MED ORDER — KETOROLAC TROMETHAMINE 30 MG/ML IJ SOLN
INTRAMUSCULAR | Status: AC
  Filled 2023-04-18: qty 1

## 2023-04-18 MED ORDER — BUPIVACAINE LIPOSOME 1.3 % IJ SUSP
INTRAMUSCULAR | Status: DC | PRN
  Administered 2023-04-18: 20 mL

## 2023-04-18 MED ORDER — BUPIVACAINE HCL (PF) 0.5 % IJ SOLN
INTRAMUSCULAR | Status: AC
  Filled 2023-04-18: qty 10

## 2023-04-18 MED ORDER — LACTATED RINGERS IV SOLN: INTRAVENOUS | Status: AC

## 2023-04-18 MED ORDER — ROCURONIUM BROMIDE 10 MG/ML (PF) SYRINGE
PREFILLED_SYRINGE | INTRAVENOUS | Status: AC
  Filled 2023-04-18: qty 10

## 2023-04-18 MED ORDER — DEXAMETHASONE SODIUM PHOSPHATE 10 MG/ML IJ SOLN
INTRAMUSCULAR | Status: AC
  Filled 2023-04-18: qty 1

## 2023-04-18 MED ORDER — FENTANYL CITRATE (PF) 100 MCG/2ML IJ SOLN
INTRAMUSCULAR | Status: AC
  Filled 2023-04-18: qty 2

## 2023-04-18 MED ORDER — EPHEDRINE SULFATE-NACL 50-0.9 MG/10ML-% IV SOSY
PREFILLED_SYRINGE | INTRAVENOUS | Status: DC | PRN
  Administered 2023-04-18: 5 mg via INTRAVENOUS

## 2023-04-18 MED ORDER — ONDANSETRON 4 MG PO TBDP
4.0000 mg | ORAL_TABLET | Freq: Three times a day (TID) | ORAL | 1 refills | Status: DC | PRN
  Filled 2023-04-18: qty 20, 7d supply, fill #0

## 2023-04-18 MED ORDER — FENTANYL CITRATE PF 50 MCG/ML IJ SOSY
PREFILLED_SYRINGE | INTRAMUSCULAR | Status: AC
  Filled 2023-04-18: qty 1

## 2023-04-18 MED ORDER — TRETINOIN 0.025 % EX CREA: 1.0000 | TOPICAL_CREAM | CUTANEOUS | Status: DC

## 2023-04-18 MED ORDER — PROPOFOL 10 MG/ML IV BOLUS
INTRAVENOUS | Status: DC | PRN
  Administered 2023-04-18: 50 mg via INTRAVENOUS
  Administered 2023-04-18: 150 mg via INTRAVENOUS

## 2023-04-18 MED ORDER — LIDOCAINE HCL (CARDIAC) PF 100 MG/5ML IV SOSY
PREFILLED_SYRINGE | INTRAVENOUS | Status: DC | PRN
  Administered 2023-04-18: 60 mg via INTRAVENOUS

## 2023-04-18 MED ORDER — CEFAZOLIN SODIUM-DEXTROSE 2-4 GM/100ML-% IV SOLN
INTRAVENOUS | Status: AC
  Filled 2023-04-18: qty 100

## 2023-04-18 MED ORDER — ROCURONIUM BROMIDE 100 MG/10ML IV SOLN
INTRAVENOUS | Status: DC | PRN
  Administered 2023-04-18: 50 mg via INTRAVENOUS
  Administered 2023-04-18: 20 mg via INTRAVENOUS

## 2023-04-18 MED ORDER — MIDAZOLAM HCL 2 MG/2ML IJ SOLN
1.0000 mg | Freq: Once | INTRAMUSCULAR | Status: AC
  Administered 2023-04-18: 1 mg via INTRAVENOUS

## 2023-04-18 MED ORDER — LACTATED RINGERS IV SOLN: INTRAVENOUS | Status: DC

## 2023-04-18 MED ORDER — CHLORHEXIDINE GLUCONATE 0.12 % MT SOLN
15.0000 mL | Freq: Once | OROMUCOSAL | Status: AC
  Administered 2023-04-18: 15 mL via OROMUCOSAL

## 2023-04-18 MED ORDER — HYDROCODONE-ACETAMINOPHEN 5-325 MG PO TABS: 1.0000 | ORAL_TABLET | Freq: Four times a day (QID) | ORAL | 0 refills | Status: DC | PRN

## 2023-04-18 MED ORDER — ONDANSETRON 4 MG PO TBDP: 4.0000 mg | ORAL_TABLET | Freq: Three times a day (TID) | ORAL | 1 refills | Status: DC | PRN

## 2023-04-18 MED ORDER — BUPIVACAINE-EPINEPHRINE 0.5% -1:200000 IJ SOLN
INTRAMUSCULAR | Status: DC | PRN
  Administered 2023-04-18: 30 mL

## 2023-04-18 MED ORDER — PHENYLEPHRINE 80 MCG/ML (10ML) SYRINGE FOR IV PUSH (FOR BLOOD PRESSURE SUPPORT)
PREFILLED_SYRINGE | INTRAVENOUS | Status: DC | PRN
  Administered 2023-04-18 (×2): 80 ug via INTRAVENOUS
  Administered 2023-04-18: 160 ug via INTRAVENOUS
  Administered 2023-04-18 (×2): 80 ug via INTRAVENOUS

## 2023-04-18 MED ORDER — HYDROCODONE-ACETAMINOPHEN 5-325 MG PO TABS
2.0000 | ORAL_TABLET | Freq: Four times a day (QID) | ORAL | 0 refills | Status: DC | PRN
  Filled 2023-04-18: qty 40, 5d supply, fill #0

## 2023-04-18 MED ORDER — FENTANYL CITRATE (PF) 100 MCG/2ML IJ SOLN
INTRAMUSCULAR | Status: DC | PRN
  Administered 2023-04-18 (×3): 50 ug via INTRAVENOUS

## 2023-04-18 MED ORDER — PHENYLEPHRINE HCL (PRESSORS) 10 MG/ML IV SOLN
INTRAVENOUS | Status: AC
  Filled 2023-04-18: qty 1

## 2023-04-18 MED ORDER — DEXMEDETOMIDINE HCL IN NACL 80 MCG/20ML IV SOLN
INTRAVENOUS | Status: DC | PRN
  Administered 2023-04-18: 8 ug via INTRAVENOUS

## 2023-04-18 MED ORDER — ACETAMINOPHEN 10 MG/ML IV SOLN
INTRAVENOUS | Status: AC
  Filled 2023-04-18: qty 100

## 2023-04-18 MED ORDER — OXYCODONE HCL 5 MG PO TABS: 5.0000 mg | ORAL_TABLET | Freq: Once | ORAL | Status: DC | PRN

## 2023-04-18 MED ORDER — LIDOCAINE HCL (PF) 2 % IJ SOLN
INTRAMUSCULAR | Status: AC
  Filled 2023-04-18: qty 5

## 2023-04-18 MED ORDER — BUPIVACAINE LIPOSOME 1.3 % IJ SUSP
INTRAMUSCULAR | Status: AC
  Filled 2023-04-18: qty 20

## 2023-04-18 MED ORDER — MIDAZOLAM HCL 2 MG/2ML IJ SOLN
INTRAMUSCULAR | Status: AC
Start: 2023-04-18 — End: ?
  Filled 2023-04-18: qty 2

## 2023-04-18 MED ORDER — RINGERS IRRIGATION IR SOLN
Status: DC | PRN
  Administered 2023-04-18: 1

## 2023-04-18 MED ORDER — PHENYLEPHRINE 80 MCG/ML (10ML) SYRINGE FOR IV PUSH (FOR BLOOD PRESSURE SUPPORT)
PREFILLED_SYRINGE | INTRAVENOUS | Status: AC
  Filled 2023-04-18: qty 10

## 2023-04-18 MED ORDER — CEFAZOLIN SODIUM-DEXTROSE 2-4 GM/100ML-% IV SOLN
2.0000 g | INTRAVENOUS | Status: AC
  Administered 2023-04-18: 2 g via INTRAVENOUS

## 2023-04-18 MED ORDER — FENTANYL CITRATE (PF) 100 MCG/2ML IJ SOLN: 25.0000 ug | INTRAMUSCULAR | Status: DC | PRN

## 2023-04-18 MED ORDER — ONDANSETRON HCL 4 MG/2ML IJ SOLN
INTRAMUSCULAR | Status: AC
  Filled 2023-04-18: qty 2

## 2023-04-18 MED ORDER — EPINEPHRINE PF 1 MG/ML IJ SOLN
INTRAMUSCULAR | Status: AC
  Filled 2023-04-18: qty 1

## 2023-04-18 MED ORDER — BUPIVACAINE HCL (PF) 0.5 % IJ SOLN
INTRAMUSCULAR | Status: DC | PRN
  Administered 2023-04-18: 10 mL

## 2023-04-18 MED ORDER — ORAL CARE MOUTH RINSE: 15.0000 mL | Freq: Once | OROMUCOSAL | Status: AC

## 2023-04-18 MED ORDER — LACTATED RINGERS IV SOLN: INTRAVENOUS | Status: DC | PRN

## 2023-04-18 MED ORDER — ONDANSETRON HCL 4 MG/2ML IJ SOLN
INTRAMUSCULAR | Status: DC | PRN
  Administered 2023-04-18: 4 mg via INTRAVENOUS

## 2023-04-18 MED ORDER — OXYCODONE HCL 5 MG/5ML PO SOLN: 5.0000 mg | Freq: Once | ORAL | Status: DC | PRN

## 2023-04-18 MED ORDER — ACETAMINOPHEN 10 MG/ML IV SOLN
INTRAVENOUS | Status: DC | PRN
  Administered 2023-04-18: 1000 mg via INTRAVENOUS

## 2023-04-18 MED ORDER — BUPIVACAINE-EPINEPHRINE (PF) 0.5% -1:200000 IJ SOLN
INTRAMUSCULAR | Status: AC
  Filled 2023-04-18: qty 30

## 2023-04-18 MED ORDER — SUGAMMADEX SODIUM 200 MG/2ML IV SOLN
INTRAVENOUS | Status: DC | PRN
  Administered 2023-04-18: 250 mg via INTRAVENOUS

## 2023-04-18 MED ORDER — CHLORHEXIDINE GLUCONATE 0.12 % MT SOLN
OROMUCOSAL | Status: AC
  Filled 2023-04-18: qty 15

## 2023-04-18 MED ORDER — DEXAMETHASONE SODIUM PHOSPHATE 10 MG/ML IJ SOLN
INTRAMUSCULAR | Status: DC | PRN
  Administered 2023-04-18: 10 mg via INTRAVENOUS

## 2023-04-18 MED ORDER — ONDANSETRON HCL 4 MG/2ML IJ SOLN
4.0000 mg | Freq: Once | INTRAMUSCULAR | Status: AC | PRN
  Administered 2023-04-18: 4 mg via INTRAVENOUS

## 2023-04-18 MED ORDER — FENTANYL CITRATE PF 50 MCG/ML IJ SOSY
50.0000 ug | PREFILLED_SYRINGE | Freq: Once | INTRAMUSCULAR | Status: AC
  Administered 2023-04-18: 50 ug via INTRAVENOUS

## 2023-04-18 MED ORDER — ACETAMINOPHEN 10 MG/ML IV SOLN: 1000.0000 mg | Freq: Once | INTRAVENOUS | Status: DC | PRN

## 2023-04-18 SURGICAL SUPPLY — 46 items
ANCHOR BONE REGENETEN (Anchor) IMPLANT
ANCHOR JUGGERKNOT WTAP NDL 2.9 (Anchor) IMPLANT
ANCHOR TENDON REGENETEN (Staple) IMPLANT
BIT DRILL JUGRKNT W/NDL BIT2.9 (DRILL) IMPLANT
BLADE FULL RADIUS 3.5 (BLADE) ×1 IMPLANT
BUR ACROMIONIZER 4.0 (BURR) ×1 IMPLANT
CHLORAPREP W/TINT 26 (MISCELLANEOUS) ×1 IMPLANT
COVER MAYO STAND STRL (DRAPES) ×1 IMPLANT
DILATOR 5.5 THREADED HEALICOIL (MISCELLANEOUS) IMPLANT
DRILL JUGGERKNOT W/NDL BIT 2.9 (DRILL) ×1
ELECT CAUTERY BLADE 6.4 (BLADE) ×1 IMPLANT
ELECT REM PT RETURN 9FT ADLT (ELECTROSURGICAL) ×1
ELECTRODE REM PT RTRN 9FT ADLT (ELECTROSURGICAL) ×1 IMPLANT
GAUZE SPONGE 4X4 12PLY STRL (GAUZE/BANDAGES/DRESSINGS) ×1 IMPLANT
GAUZE XEROFORM 1X8 LF (GAUZE/BANDAGES/DRESSINGS) ×1 IMPLANT
GLOVE BIO SURGEON STRL SZ7.5 (GLOVE) ×2 IMPLANT
GLOVE BIO SURGEON STRL SZ8 (GLOVE) ×2 IMPLANT
GLOVE BIOGEL PI IND STRL 8 (GLOVE) ×1 IMPLANT
GLOVE INDICATOR 8.0 STRL GRN (GLOVE) ×1 IMPLANT
GOWN STRL REUS W/ TWL LRG LVL3 (GOWN DISPOSABLE) ×1 IMPLANT
GOWN STRL REUS W/ TWL XL LVL3 (GOWN DISPOSABLE) ×1 IMPLANT
GRASPER SUT 15 45D LOW PRO (SUTURE) IMPLANT
IMPL REGENETEN MEDIUM (Shoulder) IMPLANT
IMPLANT REGENETEN MEDIUM (Shoulder) ×1 IMPLANT
IV LR IRRIG 3000ML ARTHROMATIC (IV SOLUTION) ×2 IMPLANT
KIT CANNULA 8X76-LX IN CANNULA (CANNULA) ×1 IMPLANT
MANIFOLD NEPTUNE II (INSTRUMENTS) ×1 IMPLANT
MASK FACE SPIDER DISP (MASK) ×1 IMPLANT
MAT ABSORB FLUID 56X50 GRAY (MISCELLANEOUS) ×1 IMPLANT
PACK ARTHROSCOPY SHOULDER (MISCELLANEOUS) ×1 IMPLANT
PAD ABD DERMACEA PRESS 5X9 (GAUZE/BANDAGES/DRESSINGS) ×2 IMPLANT
PASSER SUT FIRSTPASS SELF (INSTRUMENTS) IMPLANT
SLING ARM LRG DEEP (SOFTGOODS) ×1 IMPLANT
SLING ULTRA II LG (MISCELLANEOUS) ×1 IMPLANT
SPONGE T-LAP 18X18 ~~LOC~~+RFID (SPONGE) ×1 IMPLANT
STAPLER SKIN PROX 35W (STAPLE) ×1 IMPLANT
STRAP SAFETY 5IN WIDE (MISCELLANEOUS) ×1 IMPLANT
SUT ETHIBOND 0 MO6 C/R (SUTURE) ×1 IMPLANT
SUT ULTRABRAID 2 COBRAID 38 (SUTURE) IMPLANT
SUT VIC AB 2-0 CT1 TAPERPNT 27 (SUTURE) ×2 IMPLANT
TAPE MICROFOAM 4IN (TAPE) ×1 IMPLANT
TRAP FLUID SMOKE EVACUATOR (MISCELLANEOUS) ×1 IMPLANT
TUBE SET DOUBLEFLO INFLOW (TUBING) ×1 IMPLANT
TUBING CONNECTING 10 (TUBING) ×1 IMPLANT
WAND WEREWOLF FLOW 90D (MISCELLANEOUS) ×1 IMPLANT
WATER STERILE IRR 500ML POUR (IV SOLUTION) ×1 IMPLANT

## 2023-04-18 NOTE — Op Note (Signed)
04/18/2023  3:34 PM  Patient:   Susan Bentley  Pre-Op Diagnosis:   Impingement/tendinopathy with possible partial-thickness rotator cuff tear and biceps tendinopathy, left shoulder.  Post-Op Diagnosis:   Impingement/tendinopathy with partial-thickness rotator cuff tear, degenerative labral fraying, and biceps tendinopathy, left shoulder.  Procedure:   Limited arthroscopic debridement, arthroscopic subacromial decompression, mini-open rotator cuff repair using a Regeneten patch, and mini-open biceps tenodesis, left shoulder.  Anesthesia:   General endotracheal with interscalene block using Exparel placed preoperatively by the anesthesiologist.  Surgeon:   Maryagnes Amos, MD  Assistant:   Juanetta Snow, PA-S  Findings:   As above. There was an articular sided partial-thickness tear involving the superior insertional fibers of the subscapularis tendon with approximately 10% involvement of the footprint, as well as an articular sided partial-thickness tear involving the anterior insertional fibers of the supraspinatus tendon involving approximately 40-50% of the footprint. The remainder of the rotator cuff was in satisfactory condition. There was moderate tendinopathic changes involving the biceps tendon without partial full-thickness tearing. There was fraying of the anterior and superior portions of the labrum without frank detachment from the glenoid rim. The articular surfaces of the glenoid and humerus both were in satisfactory condition.  Complications:   None  Fluids:   500 cc  Estimated blood loss:   20 cc  Tourniquet time:   None  Drains:   None  Closure:   Staples      Brief clinical note:   The patient is a 53 year old female with a long history of gradually worsening left shoulder pain. The patient's symptoms have progressed despite medications, activity modification, etc. The patient's history and examination are consistent with impingement/tendinopathy with a rotator cuff  tear. These findings were confirmed by MRI scan. The patient presents at this time for definitive management of these shoulder symptoms.  Procedure:   The patient underwent placement of an interscalene block using Exparel by the anesthesiologist in the preoperative holding area before being brought into the operating room and lain in the supine position. The patient then underwent general endotracheal intubation and anesthesia before being repositioned in the beach chair position using the beach chair positioner. The left shoulder and upper extremity were prepped with ChloraPrep solution before being draped sterilely. Preoperative antibiotics were administered. A timeout was performed to confirm the proper surgical site before the expected portal sites and incision site were injected with 0.5% Sensorcaine with epinephrine.   A posterior portal was created and the glenohumeral joint thoroughly inspected with the findings as described above. An anterior portal was created using an outside-in technique. The labrum and rotator cuff were further probed, again confirming the above-noted findings. The areas of labral fraying were debrided back to stable margins using the full-radius resector, as were the torn margins of the partial-thickness subscapularis tendon and supraspinatus tendon tears. In addition, areas of synovitis were debrided using the full-radius resector. The ArthroCare wand was inserted and used to release the biceps tendon from its labral anchor.  It also was used to obtain hemostasis as well as to "anneal" the labrum superiorly and anteriorly. The instruments were removed from the joint after suctioning the excess fluid.  The camera was repositioned through the posterior portal into the subacromial space. A separate lateral portal was created using an outside-in technique. The 3.5 mm full-radius resector was introduced and used to perform a subtotal bursectomy. The ArthroCare wand was then inserted  and used to remove the periosteal tissue off the undersurface of the anterior  third of the acromion as well as to recess the coracoacromial ligament from its attachment along the anterior and lateral margins of the acromion. The 4.0 mm acromionizing bur was introduced and used to complete the decompression by removing the undersurface of the anterior third of the acromion. The full radius resector was reintroduced to remove any residual bony debris before the ArthroCare wand was reintroduced to obtain hemostasis. The instruments were then removed from the subacromial space after suctioning the excess fluid.  An approximately 4-5 cm incision was made over the anterolateral aspect of the shoulder beginning at the anterolateral corner of the acromion and extending distally in line with the bicipital groove. This incision was carried down through the subcutaneous tissues to expose the deltoid fascia. The raphae between the anterior and middle thirds was identified and this plane developed to provide access into the subacromial space. Additional bursal tissues were debrided sharply using Metzenbaum scissors. The rotator cuff tear was readily identified.   The bicipital groove was identified by palpation and opened for 1-1.5 cm. The biceps tendon stump was retrieved through this defect. The floor of the bicipital groove was roughened with a curet before a single Biomet 2.9 mm JuggerKnot anchor was inserted. Both sets of sutures were passed through the biceps tendon and tied securely to effect the tenodesis. The bicipital sheath was reapproximated using two #0 Ethibond interrupted sutures, incorporating the biceps tendon to further reinforce the tenodesis.  Attention was directed to the rotator cuff. The bursal surface of the rotator cuff was in excellent condition. However, careful palpation demonstrated the area of thinning corresponding to the area of the articular sided partial-thickness tear involving the  anterior insertional fibers of the supraspinatus tendon. Rather than completing the tear and repairing it, it was felt best to repair it by applying a [employer] Regeneten patch over this portion of the rotator cuff. Therefore, a medium sized patch was selected and secured over the rotator cuff using the appropriate bone and soft tissue staples.  The wound was copiously irrigated with sterile saline solution before the deltoid raphae was reapproximated using 2-0 Vicryl interrupted sutures. The subcutaneous tissues were closed in two layers using 2-0 Vicryl interrupted sutures before the skin was closed using staples. The portal sites also were closed using staples. A sterile bulky dressing was applied to the shoulder before the arm was placed into a shoulder immobilizer. The patient was then awakened, extubated, and returned to the recovery room in satisfactory condition after tolerating the procedure well.

## 2023-04-18 NOTE — Anesthesia Procedure Notes (Signed)
Procedure Name: Intubation Date/Time: 04/18/2023 1:29 PM  Performed by: Marcy Siren, RNPre-anesthesia Checklist: Patient identified, Patient being monitored, Timeout performed, Emergency Drugs available and Suction available Patient Re-evaluated:Patient Re-evaluated prior to induction Oxygen Delivery Method: Circle system utilized Preoxygenation: Pre-oxygenation with 100% oxygen Induction Type: IV induction Ventilation: Mask ventilation without difficulty and Oral airway inserted - appropriate to patient size Laryngoscope Size: 3 and McGrath Grade View: Grade I Tube type: Oral Tube size: 7.0 mm Number of attempts: 1 Airway Equipment and Method: Stylet Placement Confirmation: ETT inserted through vocal cords under direct vision, positive ETCO2 and breath sounds checked- equal and bilateral Secured at: 22 cm Tube secured with: Tape Dental Injury: Teeth and Oropharynx as per pre-operative assessment  Comments: Smooth atraumatic intubation, no complications noted.

## 2023-04-18 NOTE — Anesthesia Procedure Notes (Signed)
Anesthesia Regional Block: Interscalene brachial plexus block   Pre-Anesthetic Checklist: , timeout performed,  Correct Patient, Correct Site, Correct Laterality,  Correct Procedure,, risks and benefits discussed,  Surgical consent,  Pre-op evaluation,  At surgeon's request and post-op pain management  Laterality: Left  Prep: chloraprep       Needles:  Injection technique: Single-shot  Needle Type: Echogenic Needle          Additional Needles:   Procedures:,,,, ultrasound used (permanent image in chart),,   Motor weakness within 20 minutes.  Narrative:  Start time: 04/18/2023 12:27 PM End time: 04/18/2023 12:28 PM Injection made incrementally with aspirations every 5 mL.  Performed by: Personally  Anesthesiologist: Reed Breech, MD  Additional Notes: Functioning IV was confirmed and monitors applied.  Sterile prep and drape, hand hygiene and sterile gloves were used. Ultrasound guidance: relevant anatomy identified, needle position confirmed, local anesthetic spread visualized around nerve(s), vascular puncture avoided.  Image saved to electronic medical record.  Negative aspiration prior to incremental administration of local anesthetic for total 20 ml Exparel and 10 ml bupivacaine 0.5% given in interscalene distribution. The patient tolerated the procedure well. Vital signs and moderate sedation medications recorded in RN notes.

## 2023-04-18 NOTE — Anesthesia Postprocedure Evaluation (Signed)
Anesthesia Post Note  Patient: Susan Bentley  Procedure(s) Performed: SHOULDER ARTHROSCOPY WITH DEBRIDEMENT, DECOMPRESSION, MINI OPEN BICEPS TENODESIS AND POSSIBLE MINI OPEN ROTATOR CUFF REPAIR. (Left: Shoulder)  Patient location during evaluation: PACU Anesthesia Type: Regional Level of consciousness: awake and alert, oriented and patient cooperative Pain management: pain level controlled Vital Signs Assessment: post-procedure vital signs reviewed and stable Respiratory status: spontaneous breathing, nonlabored ventilation and respiratory function stable Cardiovascular status: blood pressure returned to baseline and stable Postop Assessment: adequate PO intake Anesthetic complications: no   No notable events documented.   Last Vitals:  Vitals:   04/18/23 1530 04/18/23 1545  BP: 111/81 108/76  Pulse: 98 (!) 106  Resp: 15 (!) 23  Temp:    SpO2: 97% 95%    Last Pain:  Vitals:   04/18/23 1518  TempSrc:   PainSc: Asleep                 Reed Breech

## 2023-04-18 NOTE — Anesthesia Preprocedure Evaluation (Addendum)
Anesthesia Evaluation  Patient identified by MRN, date of birth, ID band Patient awake    Reviewed: Allergy & Precautions, NPO status , Patient's Chart, lab work & pertinent test results  History of Anesthesia Complications Negative for: history of anesthetic complications  Airway Mallampati: II   Neck ROM: Full    Dental no notable dental hx.    Pulmonary neg pulmonary ROS   Pulmonary exam normal breath sounds clear to auscultation       Cardiovascular Exercise Tolerance: Good negative cardio ROS Normal cardiovascular exam Rhythm:Regular Rate:Normal     Neuro/Psych  Headaches    GI/Hepatic ,GERD  ,,  Endo/Other  Prediabetes; obesity  Renal/GU negative Renal ROS     Musculoskeletal   Abdominal   Peds  Hematology negative hematology ROS (+)   Anesthesia Other Findings   Reproductive/Obstetrics                             Anesthesia Physical Anesthesia Plan  ASA: 2  Anesthesia Plan: General and Regional   Post-op Pain Management: Regional block*   Induction: Intravenous  PONV Risk Score and Plan: 3 and Ondansetron, Dexamethasone and Treatment may vary due to age or medical condition  Airway Management Planned: Oral ETT  Additional Equipment:   Intra-op Plan:   Post-operative Plan: Extubation in OR  Informed Consent: I have reviewed the patients History and Physical, chart, labs and discussed the procedure including the risks, benefits and alternatives for the proposed anesthesia with the patient or authorized representative who has indicated his/her understanding and acceptance.     Dental advisory given  Plan Discussed with: CRNA  Anesthesia Plan Comments: (Plan for preoperative interscalene nerve block and GETA. Patient consented for risks of anesthesia including but not limited to:  - adverse reactions to medications - damage to eyes, teeth, lips or other oral  mucosa - nerve damage due to positioning  - sore throat or hoarseness - damage to heart, brain, nerves, lungs, other parts of body or loss of life  Informed patient about role of CRNA in peri- and intra-operative care.  Patient voiced understanding.)        Anesthesia Quick Evaluation

## 2023-04-18 NOTE — H&P (Signed)
History of Present Illness: Susan Bentley is a 53 y.o. female who presents today for repeat evaluation and second opinion for chronic ongoing left shoulder pain. The patient has been experiencing left shoulder pain over the course of several years, she was evaluated by me in 2019 where she underwent a left subacromial steroid injection and she reports multiple other steroid injections since then. The patient has been followed by [facility] where she did undergo repeat injections in addition to physical therapy and oral medication which did not resolve the ongoing left shoulder pain. Because of this a MRI scan was ordered for left shoulder and is detailed in the imaging section below. The patient was scheduled for a left shoulder arthroscopy however it was denied by her insurance due to the location of the surgery not being a [place] facility, the patient is a Runner, broadcasting/film/video. The patient presents today to discuss her left shoulder MRI scan and potential surgical options. She is left-hand dominant. No surgical history left shoulder. She reports increased pain with attempted motion out to the side above her head. Pain is located along the anterior and lateral aspect left shoulder. Denies any significant pain along the superior aspect shoulder. She reports an occasional grinding discomfort. She does have increased discomfort at night when she attempts to sleep. She denies any personal history of heart attack, stroke, asthma or COPD. No personal history of blood clots. She is not diabetic.  Answers submitted by the patient for this visit: Left Shoulder HPI Questionnaire (Submitted on 03/19/2023) Chief Complaint: HPI - Left Shoulder How did your symptoms begin?: gradually without injury How long ago did your symptoms begin?: 3 Years Please indicate all symptoms related to your issue: catching, grinding sensation, instability, pain, stiffness, weakness Where is your pain located? (select all that  apply): left arm Please select what best describes your pain (choose all that apply): aching, radiating, sharp, shooting, stabbing, throbbing Do you have neck pain?: No Do you have pain that travels to the fingers of the affected shoulder?: No Please choose the severity that best describes your pain: moderate (active but had to make modifications or give up activities) Rate the pain on a scale of 0 (no pain) to 10 (worst pain ever): 7/10 How often does the pain occur?: constant Since the onset of your problem, has the pain improved, worsened, or stayed the same?: worsening Does your shoulder pain prevent you from performing your hobbies?: Yes Are you able to perform sports at the same level before the injury?: No Do you lift a lot of weight with your shoulders?: No Does your shoulder feel stable?: No Does your shoulder feel weak (like it may dislocate)?: Yes Do your symptoms wake you from sleep?: Yes Please choose all the items that worsen your symptoms: normal daily activities, activity in general, carrying heavy objects, getting dressed, overhead activity, reaching behind back, sleeping Please indicate any studies you've had for this problem (select all that apply): MRI, X-ray(s) Please select all prior treatments you have had for this problem: heat/ice, injection(s), medications Questionnaire about: HPI - Left Shoulder (Submitted on 03/19/2023) Chief Complaint: HPI - Left Shoulder  Past Medical History: No past medical history on file.  Past Surgical History: FUNCTIONAL ENDOSCOPIC SINUS SURGERY 2000  COLONOSCOPY 12/31/2010 (Dr. Demetrius Charity. [place] @ [name] - Ext. Hemorrhoids)  COLONOSCOPY 02/11/2019 (Diverticulosis/FHx CC-Father/Repeat 44yrs/[place])   Past Family History: Heart disease Maternal Grandmother  Breast cancer Maternal Grandmother  Leukemia Maternal Grandmother  Heart disease Maternal Grandfather  Medications: albuterol 90 mcg/actuation inhaler Inhale 2 inhalations into the lungs  every 4 (four) hours as needed 6.7 g 0  ECHINACEA ORAL Take by mouth  ELDERBERRY FRUIT ORAL Take by mouth (Patient not taking: Reported on 03/24/2023)  loratadine (CLARITIN) 10 mg capsule Take 10 mg by mouth once daily.  nitrofurantoin (MACRODANTIN) 50 MG capsule Take 50 mg by mouth nightly. (Patient not taking: Reported on 03/24/2023)  omeprazole (PRILOSEC) 40 MG DR capsule Take 40 mg by mouth once daily.  peg-electrolyte (NULYTELY) solution Take 4,000 mLs by mouth as directed (Patient not taking: Reported on 03/24/2023) 4000 mL 0  predniSONE (DELTASONE) 10 MG tablet 6 PO Q D X 1 DAY, THEN 5 PO Q D X 1 DAY, THEN 4 PO Q D X 1 DAY, THEN 3 PO Q D X 1 DAY, THEN 2 PO Q D X 1 DAY, THEN 1 PO Q D X 1 DAY 21 tablet 0  promethazine-dextromethorphan (PROMETHAZINE-DM) 6.25-15 mg/5 mL syrup Take 5 mLs by mouth every 6 (six) hours as needed 120 mL 0   Allergies: Codeine Hives  Sulfa (Sulfonamide Antibiotics) Hives   Review of Systems:  A comprehensive 14 point ROS was performed, reviewed by me today, and the pertinent orthopaedic findings are documented in the HPI.  Physical Exam: BP 122/78  Ht 172.7 cm (5\' 8" )  Wt (!) 115.2 kg (254 lb)  BMI 38.62 kg/m  General/Constitutional: The patient appears to be well-nourished, well-developed, and in no acute distress. Neuro/Psych: Normal mood and affect, oriented to person, place and time. Eyes: Non-icteric. Pupils are equal, round, and reactive to light, and exhibit synchronous movement. ENT: Unremarkable. Lymphatic: No palpable adenopathy. Respiratory: Lungs clear to auscultation, Normal chest excursion, No wheezes, and Non-labored breathing Cardiovascular: Regular rate and rhythm. No murmurs. and No edema, swelling or tenderness, except as noted in detailed exam. Integumentary: No impressive skin lesions present, except as noted in detailed exam. Musculoskeletal: Unremarkable, except as noted in detailed exam.  General: Well developed, well nourished  53 y.o. female in no apparent distress. Normal affect. Normal communication. Patient answers questions appropriately. The patient has a normal gait. There is no antalgic component. There is no hip lurch.   Left Upper Extremity: Examination of the left shoulder and arm showed no bony abnormality or edema. The patient has passive motion with abduction, flexion, internal rotation, and external rotation. The patient did experience pain when reaching 100 degrees forward flexion, 110 degrees abduction and at the extremes of external rotation. The patient has a positive Hawkins test and a positive impingement test. The patient has a negative drop arm test. The patient is non-tender along the deltoid muscle. There is moderate subacromial space tenderness without AC joint tenderness. The patient has no instability of the shoulder with anterior-posterior motion. There is a negative sulcus sign. The rotator cuff muscle strength is 4/5 with supraspinatus, 4/5 with internal rotation, and 4/5 with external rotation. There is no crepitus with range of motion activities. Moderate tenderness with palpation along the left proximal bicep tendon. Positive speeds test.  Neurological: The patient has sensation that is intact to light touch and pinprick bilaterally. The patient has normal grip strength. The patient has full biceps, wrist extension, grip, and interosseous strength. The patient has 2 + DTRs bilaterally.  Vascular: The patient has less than 2 second capillary refill. The patient has normal ulnar and radial pulses. The patient has normal warmth to touch.   Imaging: MRI scan of the left shoulder was obtained on  11/25/2022. Both the report and images were reviewed at today's visit and discussed with the patient. Per the report the patient does have interstitial and articular surface fraying involving the supraspinatus tendon involving approximately 20% of depth. The patient does have a intact long head the biceps  tendon but there is fluid surrounding it indicative of underlying biceps tendinopathy. The patient does have some subacromial spurring noted on the MRI scan. Arthritic changes noted at the [facility] joint.  Impression: 1. Biceps tendonitis on left. 2. Nontraumatic incomplete tear of left rotator cuff. 3. Impingement of left shoulder.  Plan:  1. Treatment options were discussed today with the patient. 2. The patient has been experiencing chronic ongoing left shoulder pain over the past several years without relief following left subacromial steroid injections and conservative treatment. 3. The patient is quite frustrated by her continued left shoulder pain and would like discuss more aggressive treatment options. The patient was instructed on the risk and benefits of a left shoulder arthroscopy with debridement, decompression, mini open biceps tenodesis and possible rotator cuff repair. After a discussion of the risk and benefits the patient would like to proceed with surgery at this time. This will be scheduled with Dr. Joice Lofts in the future. 4. This document will serve as a surgical history and physical for the patient. 5. The patient will follow-up per standard postop protocol. They can call the clinic they have any questions, new symptoms develop or symptoms worsen.  The procedure was discussed with the patient, as were the potential risks (including bleeding, infection, nerve and/or blood vessel injury, persistent or recurrent pain, failure of the repair, progression of arthritis, need for further surgery, blood clots, strokes, heart attacks and/or arhythmias, pneumonia, etc.) and benefits. The patient states her understanding and wishes to proceed.    H&P reviewed and patient re-examined. No changes.

## 2023-04-18 NOTE — Discharge Instructions (Signed)
Orthopedic discharge instructions: Keep dressing dry and intact.  May shower after dressing changed on post-op day #4 (Saturday).  Cover staples with Band-Aids after drying off. Apply ice frequently to shoulder. Take Aleve 2 tablets BID OR ibuprofen 600-800 mg TID with meals for 3-5 days, then as necessary. Take hydrocodone as prescribed or ES Tylenol if necessary. Keep shoulder immobilizer on at all times except may remove for bathing purposes. Follow-up in 10-14 days or as scheduled.

## 2023-04-18 NOTE — Transfer of Care (Signed)
Immediate Anesthesia Transfer of Care Note  Patient: Susan Bentley  Procedure(s) Performed: SHOULDER ARTHROSCOPY WITH DEBRIDEMENT, DECOMPRESSION, MINI OPEN BICEPS TENODESIS AND POSSIBLE MINI OPEN ROTATOR CUFF REPAIR. (Left: Shoulder)  Patient Location: PACU  Anesthesia Type:General  Level of Consciousness: awake, alert , and drowsy  Airway & Oxygen Therapy: Patient Spontanous Breathing and Patient connected to face mask  Post-op Assessment: Report given to RN and Post -op Vital signs reviewed and stable  Post vital signs: Reviewed and stable  Last Vitals:  Vitals Value Taken Time  BP 109/74 04/18/23 1517  Temp 36.1 1517  Pulse 103 04/18/23 1522  Resp 19 04/18/23 1522  SpO2 96 % 04/18/23 1522  Vitals shown include unfiled device data.  Last Pain:  Vitals:   04/18/23 1230  TempSrc:   PainSc: 0-No pain         Complications: No notable events documented.

## 2023-04-19 ENCOUNTER — Encounter: Payer: Self-pay | Admitting: Surgery

## 2023-04-25 DIAGNOSIS — G8929 Other chronic pain: Secondary | ICD-10-CM | POA: Diagnosis not present

## 2023-04-25 DIAGNOSIS — Z9889 Other specified postprocedural states: Secondary | ICD-10-CM | POA: Diagnosis not present

## 2023-04-25 DIAGNOSIS — M25612 Stiffness of left shoulder, not elsewhere classified: Secondary | ICD-10-CM | POA: Diagnosis not present

## 2023-04-25 DIAGNOSIS — M6281 Muscle weakness (generalized): Secondary | ICD-10-CM | POA: Diagnosis not present

## 2023-04-25 DIAGNOSIS — M25512 Pain in left shoulder: Secondary | ICD-10-CM | POA: Diagnosis not present

## 2023-05-05 DIAGNOSIS — M6281 Muscle weakness (generalized): Secondary | ICD-10-CM | POA: Diagnosis not present

## 2023-05-05 DIAGNOSIS — M25612 Stiffness of left shoulder, not elsewhere classified: Secondary | ICD-10-CM | POA: Diagnosis not present

## 2023-05-05 DIAGNOSIS — G8929 Other chronic pain: Secondary | ICD-10-CM | POA: Diagnosis not present

## 2023-05-05 DIAGNOSIS — Z9889 Other specified postprocedural states: Secondary | ICD-10-CM | POA: Diagnosis not present

## 2023-05-05 DIAGNOSIS — M25512 Pain in left shoulder: Secondary | ICD-10-CM | POA: Diagnosis not present

## 2023-05-10 DIAGNOSIS — M25612 Stiffness of left shoulder, not elsewhere classified: Secondary | ICD-10-CM | POA: Diagnosis not present

## 2023-05-10 DIAGNOSIS — Z9889 Other specified postprocedural states: Secondary | ICD-10-CM | POA: Diagnosis not present

## 2023-05-10 DIAGNOSIS — G8929 Other chronic pain: Secondary | ICD-10-CM | POA: Diagnosis not present

## 2023-05-10 DIAGNOSIS — M6281 Muscle weakness (generalized): Secondary | ICD-10-CM | POA: Diagnosis not present

## 2023-05-10 DIAGNOSIS — M25512 Pain in left shoulder: Secondary | ICD-10-CM | POA: Diagnosis not present

## 2023-05-19 ENCOUNTER — Other Ambulatory Visit: Payer: Self-pay

## 2023-05-19 MED ORDER — TRAMADOL HCL 50 MG PO TABS
ORAL_TABLET | ORAL | 0 refills | Status: DC
  Filled 2023-05-19: qty 21, 7d supply, fill #0

## 2023-05-24 DIAGNOSIS — Z9889 Other specified postprocedural states: Secondary | ICD-10-CM | POA: Diagnosis not present

## 2023-05-24 DIAGNOSIS — M6281 Muscle weakness (generalized): Secondary | ICD-10-CM | POA: Diagnosis not present

## 2023-05-24 DIAGNOSIS — M25612 Stiffness of left shoulder, not elsewhere classified: Secondary | ICD-10-CM | POA: Diagnosis not present

## 2023-05-24 DIAGNOSIS — G8929 Other chronic pain: Secondary | ICD-10-CM | POA: Diagnosis not present

## 2023-05-24 DIAGNOSIS — M25512 Pain in left shoulder: Secondary | ICD-10-CM | POA: Diagnosis not present

## 2023-05-29 ENCOUNTER — Other Ambulatory Visit: Payer: Self-pay

## 2023-05-29 MED ORDER — FLUCONAZOLE 150 MG PO TABS
ORAL_TABLET | ORAL | 0 refills | Status: DC
  Filled 2023-05-29: qty 2, 3d supply, fill #0

## 2023-05-31 DIAGNOSIS — Z9889 Other specified postprocedural states: Secondary | ICD-10-CM | POA: Diagnosis not present

## 2023-06-05 DIAGNOSIS — Z9889 Other specified postprocedural states: Secondary | ICD-10-CM | POA: Diagnosis not present

## 2023-06-07 DIAGNOSIS — Z9889 Other specified postprocedural states: Secondary | ICD-10-CM | POA: Diagnosis not present

## 2023-06-07 DIAGNOSIS — M25612 Stiffness of left shoulder, not elsewhere classified: Secondary | ICD-10-CM | POA: Diagnosis not present

## 2023-06-07 DIAGNOSIS — M6281 Muscle weakness (generalized): Secondary | ICD-10-CM | POA: Diagnosis not present

## 2023-06-07 DIAGNOSIS — G8929 Other chronic pain: Secondary | ICD-10-CM | POA: Diagnosis not present

## 2023-06-07 DIAGNOSIS — M25512 Pain in left shoulder: Secondary | ICD-10-CM | POA: Diagnosis not present

## 2023-06-13 DIAGNOSIS — Z9889 Other specified postprocedural states: Secondary | ICD-10-CM | POA: Diagnosis not present

## 2023-06-16 DIAGNOSIS — M6281 Muscle weakness (generalized): Secondary | ICD-10-CM | POA: Diagnosis not present

## 2023-06-16 DIAGNOSIS — Z9889 Other specified postprocedural states: Secondary | ICD-10-CM | POA: Diagnosis not present

## 2023-06-16 DIAGNOSIS — M25612 Stiffness of left shoulder, not elsewhere classified: Secondary | ICD-10-CM | POA: Diagnosis not present

## 2023-06-16 DIAGNOSIS — G8929 Other chronic pain: Secondary | ICD-10-CM | POA: Diagnosis not present

## 2023-06-16 DIAGNOSIS — M25512 Pain in left shoulder: Secondary | ICD-10-CM | POA: Diagnosis not present

## 2023-06-20 DIAGNOSIS — Z9889 Other specified postprocedural states: Secondary | ICD-10-CM | POA: Diagnosis not present

## 2023-06-23 DIAGNOSIS — Z9889 Other specified postprocedural states: Secondary | ICD-10-CM | POA: Diagnosis not present

## 2023-06-23 DIAGNOSIS — M6281 Muscle weakness (generalized): Secondary | ICD-10-CM | POA: Diagnosis not present

## 2023-06-23 DIAGNOSIS — M25512 Pain in left shoulder: Secondary | ICD-10-CM | POA: Diagnosis not present

## 2023-06-23 DIAGNOSIS — G8929 Other chronic pain: Secondary | ICD-10-CM | POA: Diagnosis not present

## 2023-06-23 DIAGNOSIS — M25612 Stiffness of left shoulder, not elsewhere classified: Secondary | ICD-10-CM | POA: Diagnosis not present

## 2023-06-27 DIAGNOSIS — Z9889 Other specified postprocedural states: Secondary | ICD-10-CM | POA: Diagnosis not present

## 2023-06-30 DIAGNOSIS — Z9889 Other specified postprocedural states: Secondary | ICD-10-CM | POA: Diagnosis not present

## 2023-06-30 DIAGNOSIS — M25512 Pain in left shoulder: Secondary | ICD-10-CM | POA: Diagnosis not present

## 2023-08-04 DIAGNOSIS — M25512 Pain in left shoulder: Secondary | ICD-10-CM | POA: Diagnosis not present

## 2023-08-08 DIAGNOSIS — M25512 Pain in left shoulder: Secondary | ICD-10-CM | POA: Diagnosis not present

## 2023-08-11 DIAGNOSIS — M25512 Pain in left shoulder: Secondary | ICD-10-CM | POA: Diagnosis not present

## 2023-08-15 DIAGNOSIS — M25512 Pain in left shoulder: Secondary | ICD-10-CM | POA: Diagnosis not present

## 2023-08-22 DIAGNOSIS — M25512 Pain in left shoulder: Secondary | ICD-10-CM | POA: Diagnosis not present

## 2023-08-23 ENCOUNTER — Other Ambulatory Visit: Payer: Self-pay

## 2023-08-23 MED ORDER — CYCLOBENZAPRINE HCL 5 MG PO TABS
5.0000 mg | ORAL_TABLET | Freq: Two times a day (BID) | ORAL | 0 refills | Status: DC | PRN
  Filled 2023-08-23: qty 20, 5d supply, fill #0

## 2023-08-25 DIAGNOSIS — M25512 Pain in left shoulder: Secondary | ICD-10-CM | POA: Diagnosis not present

## 2023-08-28 DIAGNOSIS — M25812 Other specified joint disorders, left shoulder: Secondary | ICD-10-CM | POA: Diagnosis not present

## 2023-08-28 DIAGNOSIS — M75112 Incomplete rotator cuff tear or rupture of left shoulder, not specified as traumatic: Secondary | ICD-10-CM | POA: Diagnosis not present

## 2023-08-28 DIAGNOSIS — G8929 Other chronic pain: Secondary | ICD-10-CM | POA: Diagnosis not present

## 2023-08-28 DIAGNOSIS — M25512 Pain in left shoulder: Secondary | ICD-10-CM | POA: Diagnosis not present

## 2023-08-28 DIAGNOSIS — M7582 Other shoulder lesions, left shoulder: Secondary | ICD-10-CM | POA: Diagnosis not present

## 2023-08-28 DIAGNOSIS — M7522 Bicipital tendinitis, left shoulder: Secondary | ICD-10-CM | POA: Diagnosis not present

## 2023-09-08 ENCOUNTER — Other Ambulatory Visit: Payer: Self-pay

## 2023-09-08 MED ORDER — NITROFURANTOIN MACROCRYSTAL 100 MG PO CAPS
100.0000 mg | ORAL_CAPSULE | Freq: Every day | ORAL | 5 refills | Status: DC
  Filled 2023-09-08: qty 30, 30d supply, fill #0

## 2023-09-11 ENCOUNTER — Encounter: Payer: Self-pay | Admitting: Obstetrics and Gynecology

## 2023-09-19 ENCOUNTER — Encounter: Payer: Self-pay | Admitting: Obstetrics and Gynecology

## 2023-09-19 ENCOUNTER — Ambulatory Visit (INDEPENDENT_AMBULATORY_CARE_PROVIDER_SITE_OTHER): Admitting: Obstetrics and Gynecology

## 2023-09-19 VITALS — BP 107/73 | HR 89 | Ht 68.0 in | Wt 267.0 lb

## 2023-09-19 DIAGNOSIS — Z01419 Encounter for gynecological examination (general) (routine) without abnormal findings: Secondary | ICD-10-CM | POA: Diagnosis not present

## 2023-09-19 DIAGNOSIS — Z30431 Encounter for routine checking of intrauterine contraceptive device: Secondary | ICD-10-CM | POA: Diagnosis not present

## 2023-09-19 DIAGNOSIS — Z9189 Other specified personal risk factors, not elsewhere classified: Secondary | ICD-10-CM | POA: Diagnosis not present

## 2023-09-19 DIAGNOSIS — Z803 Family history of malignant neoplasm of breast: Secondary | ICD-10-CM | POA: Diagnosis not present

## 2023-09-19 DIAGNOSIS — Z1231 Encounter for screening mammogram for malignant neoplasm of breast: Secondary | ICD-10-CM

## 2023-09-19 NOTE — Patient Instructions (Addendum)
 I value your feedback and you entrusting [place] with your care. If you get a [facility] patient survey, I would appreciate you taking the time to let us know about your experience today. Thank you!  [facility] ([place])--[phone]

## 2023-09-19 NOTE — Progress Notes (Signed)
 Chief Complaint  Patient presents with  . Gynecologic Exam    No concerns.    HPI:      [name] is a 53 y.o. [phone] who LMP was No LMP recorded. (Menstrual status: IUD)., presents today for her annual examination.  Her menses are absent for the past 8 months, had spotting a few months after IUD replaced 12/23; hx of menorrhagia and bleeding had resumed after 5 yrs with Mirena , neg GYN u/s 11/23. No BTB/dysmen now. Has occas night sweats.   Sex activity: single partner, contraception - IUD. Mirena  Replaced 03/03/22. Has occas vaginal dryness, improved with lubricants. No pain/bleeding.   Last Pap: 11/10/20 Results were: no abnormalities /neg HPV DNA  Hx of STDs: HPV  Last mammogram: 11/18/22 Results were: normal--routine follow-up in 12 months.  There is a FH of breast cancer in her mom and possibly MGM (cancer was widespread--no primary site determined but thought to be breast or colon etiology, died age 1). Pt is MyRisk neg except NBN VUS 8/21. IBIS=25.0%/riskscore=26.7%. There is no FH of ovarian cancer. The patient does do self-breast exams. Had neg breast MRI 2/24. Takes Vit D supp.  Tobacco use: The patient denies current or previous tobacco use. Alcohol use: none  No drug use Exercise: mod active  Colonoscopy: 2/21, repeat after 5 yrs due to FH colon cancer; having issues with constipation.   She does get adequate calcium and Vitamin D  in her diet. Had normal levels on recent labs. Labs with PCP.    Past Medical History:  Diagnosis Date  . Allergy    Claritin and Flonas helps manage symptoms  . BRCA negative 10/2019   MyRisk neg except NBN VUS  . COVID-19   . Elevated uric acid in blood   . Family history of breast cancer 10/2019  . GERD (gastroesophageal reflux disease)   . History of abnormal cervical Pap smear    2009;2013 LGSIL - 10/13/14 neg  . History of mammogram 10/13/2014   neg  . History of Papanicolaou smear of cervix 10/13/2014   neg  .  History of recurrent UTIs   . Hypertriglyceridemia   . Impingement syndrome of shoulder region   . Increased risk of breast cancer 10/2019   IBIS=25.0%/riskscore=26.7%  . Migraine   . Pre-diabetes   . UTI (lower urinary tract infection)    Seen by urology. Macrobid  prn    Past Surgical History:  Procedure Laterality Date  . COLONOSCOPY    . COLPOSCOPY  12/02/2011  . INTRAUTERINE DEVICE (IUD) INSERTION    . SEPTOPLASTY  2000  . SHOULDER ARTHROSCOPY WITH SUBACROMIAL DECOMPRESSION, ROTATOR CUFF REPAIR AND BICEP TENDON REPAIR Left 04/18/2023   Procedure: SHOULDER ARTHROSCOPY WITH DEBRIDEMENT, DECOMPRESSION, MINI OPEN BICEPS TENODESIS AND POSSIBLE MINI OPEN ROTATOR CUFF REPAIR.;  Surgeon: Edie Norleen PARAS, MD;  Location: [name] ORS;  Service: Orthopedics;  Laterality: Left;    Family History  Problem Relation Age of Onset  . Breast cancer Mother 3  . Alzheimer's disease Mother   . Hypothyroidism Father   . Hypothyroidism Sister   . Cancer Maternal Grandmother 35       colon cancer, breast cancer, brain cancer  . Heart disease Maternal Grandfather 57  . Hyperlipidemia Paternal Grandmother   . Hypertension Paternal Grandmother   . Heart disease Paternal Grandmother 73  . Cancer Paternal Grandmother 77       leukemia  . Hyperlipidemia Paternal Grandfather   . Stroke Paternal Grandfather   .  Hypertension Paternal Grandfather   . Aneurysm Paternal Grandfather   . Heart disease Paternal Grandfather   . Cancer Other 23       colon    Social History   Socioeconomic History  . Marital status: Married    Spouse name: [name]  . Number of children: 2  . Years of education: 16  . Highest education level: Bachelor's degree (e.g., BA, AB, BS)  Occupational History  . Occupation: Charity fundraiser - Occupational Health    Comment: [name]  Tobacco Use  . Smoking status: Never  . Smokeless tobacco: Never  Vaping Use  . Vaping status: Never Used  Substance and Sexual Activity  . Alcohol use: Yes     Alcohol/week: 0.0 standard drinks of alcohol    Comment: occ  . Drug use: No  . Sexual activity: Yes    Birth control/protection: I.U.D.    Comment: Mirena   Other Topics Concern  . Not on file  Social History Narrative   Susan Bentley grew up in [place]. She lives at home with her husband, [name], of 3 years. Her youngest son, [name], is a Holiday representative in [employer] and lives at home with them. Susan Bentley has another son, Susan Bentley, who is away at college at [employer]. Susan Bentley attended [employer] and obtained her Bachelors in Nursing. 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 She enjoys travel as much as able. She also enjoys gardening in the summer. She also quilts.   Social Drivers of Corporate investment banker Strain: Low Risk  (01/02/2023)   Overall Financial Resource Strain (CARDIA)   . Difficulty of Paying Living Expenses: Not very hard  Food Insecurity: Unknown (01/02/2023)   Hunger Vital Sign   . Worried About Programme researcher, broadcasting/film/video in the Last Year: Never true   . Ran Out of Food in the Last Year: Patient declined  Transportation Needs: No Transportation Needs (01/02/2023)   PRAPARE - Transportation   . Lack of Transportation (Medical): No   . Lack of Transportation (Non-Medical): No  Physical Activity: Unknown (01/02/2023)   Exercise Vital Sign   . Days of Exercise per Week: 0 days   . Minutes of Exercise per Session: Not on file  Stress: No Stress Concern Present (01/02/2023)   [employer] of Occupational Health - Occupational Stress Questionnaire   . Feeling of Stress : Only a little  Social Connections: Moderately Integrated (01/02/2023)   Social Connection and Isolation Panel   . Frequency of Communication with Friends and Family: More than three times a week   . Frequency of Social Gatherings with Friends and Family: Once a week   . Attends Religious Services: 1 to 4 times per year   . Active Member of Clubs or Organizations: No   . Attends Banker Meetings: Not  on file   . Marital Status: Married  Catering manager Violence: Not on file     Current Outpatient Medications:  .  acetaminophen  (TYLENOL ) 500 MG tablet, Take 1,000 mg by mouth every 6 (six) hours as needed for moderate pain (pain score 4-6)., Disp: , Rfl:  .  Coenzyme Q10 (COQ10 PO), Take 1 tablet by mouth daily., Disp: , Rfl:  .  fluticasone  (FLONASE ) 50 MCG/ACT nasal spray, Place 2 sprays into both nostrils daily., Disp: , Rfl:  .  ibuprofen (ADVIL) 200 MG tablet, Take 400 mg by mouth every 6 (six) hours as needed for moderate pain (pain score 4-6)., Disp: , Rfl:  .  levonorgestrel  (MIRENA ) 20  MCG/DAY IUD, 1 each by Intrauterine route once., Disp: , Rfl:  .  Loratadine 10 MG CAPS, Take 1 tablet by mouth daily., Disp: , Rfl:  .  Meth-Hyo-M Bl-Na Phos-Ph Sal (URIBEL ) 118 MG CAPS, Take 1 capsule (118 mg total) by mouth daily as needed., Disp: 90 capsule, Rfl: 3 .  Multiple Vitamin (MULTIVITAMIN WITH MINERALS) TABS tablet, Take 1 tablet by mouth daily., Disp: , Rfl:  .  nitrofurantoin , macrocrystal-monohydrate, (MACROBID ) 100 MG capsule, Take 1 capsule (100 mg total) by mouth after sex as preventative, Disp: 30 capsule, Rfl: 0 .  tretinoin  (RETIN-A ) 0.025 % cream, Apply 1 Application topically 3 (three) times a week., Disp: , Rfl:  .  VITAMIN D  PO, Take 1 capsule by mouth daily., Disp: , Rfl:   ROS:  Review of Systems  Constitutional:  Negative for fatigue, fever and unexpected weight change.  Respiratory:  Negative for cough, shortness of breath and wheezing.   Cardiovascular:  Negative for chest pain, palpitations and leg swelling.  Gastrointestinal:  Negative for blood in stool, constipation, diarrhea, nausea and vomiting.  Endocrine: Negative for cold intolerance, heat intolerance and polyuria.  Genitourinary:  Negative for dyspareunia, dysuria, flank pain, frequency, genital sores, hematuria, menstrual problem, pelvic pain, urgency, vaginal bleeding, vaginal discharge and vaginal  pain.  Musculoskeletal:  Negative for arthralgias, back pain, joint swelling and myalgias.  Skin:  Negative for rash.  Neurological:  Negative for dizziness, syncope, light-headedness, numbness and headaches.  Hematological:  Negative for adenopathy.  Psychiatric/Behavioral:  Negative for agitation, confusion, sleep disturbance and suicidal ideas. The patient is not nervous/anxious.      Objective: BP 107/73   Pulse 89   Ht 5' 8 (1.727 m)   Wt 267 lb (121.1 kg)   BMI 40.60 kg/m    Physical Exam Constitutional:      Appearance: She is well-developed.  Genitourinary:     Vulva normal.     Right Labia: No rash, tenderness or lesions.    Left Labia: No tenderness, lesions or rash.    No vaginal discharge, erythema or tenderness.      Right Adnexa: not tender and no mass present.    Left Adnexa: not tender and no mass present.    No cervical motion tenderness, friability or polyp.     IUD strings visualized.     Uterus is not enlarged or tender.  Breasts:    Right: No mass, nipple discharge, skin change or tenderness.     Left: No mass, nipple discharge, skin change or tenderness.  Neck:     Thyroid : No thyromegaly.   Cardiovascular:     Rate and Rhythm: Normal rate and regular rhythm.     Heart sounds: Normal heart sounds. No murmur heard. Pulmonary:     Effort: Pulmonary effort is normal.     Breath sounds: Normal breath sounds.  Abdominal:     Palpations: Abdomen is soft.     Tenderness: There is no abdominal tenderness. There is no guarding or rebound.   Musculoskeletal:        General: Normal range of motion.     Cervical back: Normal range of motion.  Lymphadenopathy:     Cervical: No cervical adenopathy.   Neurological:     General: No focal deficit present.     Mental Status: She is alert and oriented to person, place, and time.     Cranial Nerves: No cranial nerve deficit.   Skin:    General: Skin is warm  and dry.   Psychiatric:        Mood and  Affect: Mood normal.        Behavior: Behavior normal.        Thought Content: Thought content normal.        Judgment: Judgment normal.  Vitals reviewed.    Assessment/Plan: Encounter for annual routine gynecological examination  Encounter for routine checking of intrauterine contraceptive device (IUD)--has 8 yr indication, doing well.   Encounter for screening mammogram for malignant neoplasm of breast - Plan: MM 3D SCREENING MAMMOGRAM BILATERAL BREAST; pt to schedule mammo  Family history of breast cancer--My Risk neg  Increased risk of breast cancer--pt aware of monthly SBE, yearly CBE and mammos. Did scr breast MRI 2/24, will call if wants again. Didn't like [facility]             GYN counsel mammography screening, adequate intake of calcium and vitamin D      F/U  Return in about 1 year (around 09/18/2024).  Knoah Nedeau B. Bowen Goyal, PA-C 09/19/2023 4:15 PM

## 2023-10-16 DIAGNOSIS — M7582 Other shoulder lesions, left shoulder: Secondary | ICD-10-CM | POA: Diagnosis not present

## 2023-10-16 DIAGNOSIS — M75112 Incomplete rotator cuff tear or rupture of left shoulder, not specified as traumatic: Secondary | ICD-10-CM | POA: Diagnosis not present

## 2023-10-16 DIAGNOSIS — M7522 Bicipital tendinitis, left shoulder: Secondary | ICD-10-CM | POA: Diagnosis not present

## 2023-11-24 ENCOUNTER — Ambulatory Visit
Admission: RE | Admit: 2023-11-24 | Discharge: 2023-11-24 | Disposition: A | Discharge status: Home / Self Care | Source: Ambulatory Visit | Attending: Obstetrics and Gynecology | Admitting: Obstetrics and Gynecology

## 2023-11-24 DIAGNOSIS — Z9189 Other specified personal risk factors, not elsewhere classified: Secondary | ICD-10-CM | POA: Diagnosis not present

## 2023-11-24 DIAGNOSIS — Z1231 Encounter for screening mammogram for malignant neoplasm of breast: Secondary | ICD-10-CM | POA: Diagnosis not present

## 2023-11-24 DIAGNOSIS — Z803 Family history of malignant neoplasm of breast: Secondary | ICD-10-CM | POA: Insufficient documentation

## 2023-11-30 ENCOUNTER — Other Ambulatory Visit: Payer: Self-pay | Admitting: Obstetrics and Gynecology

## 2023-11-30 ENCOUNTER — Ambulatory Visit: Payer: Self-pay | Admitting: Obstetrics and Gynecology

## 2023-11-30 DIAGNOSIS — R928 Other abnormal and inconclusive findings on diagnostic imaging of breast: Secondary | ICD-10-CM

## 2023-12-06 ENCOUNTER — Other Ambulatory Visit: Payer: Self-pay | Admitting: Obstetrics and Gynecology

## 2023-12-06 ENCOUNTER — Ambulatory Visit
Admission: RE | Admit: 2023-12-06 | Discharge: 2023-12-06 | Disposition: A | Discharge status: Home / Self Care | Source: Ambulatory Visit | Attending: Obstetrics and Gynecology | Admitting: Obstetrics and Gynecology

## 2023-12-06 DIAGNOSIS — Z1231 Encounter for screening mammogram for malignant neoplasm of breast: Secondary | ICD-10-CM

## 2023-12-06 DIAGNOSIS — Z9189 Other specified personal risk factors, not elsewhere classified: Secondary | ICD-10-CM

## 2023-12-06 DIAGNOSIS — Z1239 Encounter for other screening for malignant neoplasm of breast: Secondary | ICD-10-CM

## 2023-12-06 DIAGNOSIS — R928 Other abnormal and inconclusive findings on diagnostic imaging of breast: Secondary | ICD-10-CM

## 2023-12-06 DIAGNOSIS — R92323 Mammographic fibroglandular density, bilateral breasts: Secondary | ICD-10-CM | POA: Diagnosis not present

## 2023-12-06 DIAGNOSIS — Z803 Family history of malignant neoplasm of breast: Secondary | ICD-10-CM

## 2023-12-06 DIAGNOSIS — R921 Mammographic calcification found on diagnostic imaging of breast: Secondary | ICD-10-CM | POA: Diagnosis not present

## 2023-12-07 ENCOUNTER — Ambulatory Visit: Payer: Self-pay | Admitting: Obstetrics and Gynecology

## 2023-12-18 ENCOUNTER — Ambulatory Visit
Admission: RE | Admit: 2023-12-18 | Discharge: 2023-12-18 | Disposition: A | Discharge status: Home / Self Care | Source: Ambulatory Visit | Attending: Obstetrics and Gynecology | Admitting: Obstetrics and Gynecology

## 2023-12-18 DIAGNOSIS — R921 Mammographic calcification found on diagnostic imaging of breast: Secondary | ICD-10-CM | POA: Diagnosis not present

## 2023-12-18 DIAGNOSIS — R928 Other abnormal and inconclusive findings on diagnostic imaging of breast: Secondary | ICD-10-CM | POA: Insufficient documentation

## 2023-12-18 DIAGNOSIS — N6032 Fibrosclerosis of left breast: Secondary | ICD-10-CM | POA: Insufficient documentation

## 2023-12-18 HISTORY — PX: BREAST BIOPSY: SHX20

## 2023-12-18 MED ORDER — LIDOCAINE-EPINEPHRINE 1 %-1:100000 IJ SOLN
20.0000 mL | Freq: Once | INTRAMUSCULAR | Status: AC
  Administered 2023-12-18: 20 mL
  Filled 2023-12-18: qty 20

## 2023-12-18 MED ORDER — LIDOCAINE 1 % OPTIME INJ - NO CHARGE
5.0000 mL | Freq: Once | INTRAMUSCULAR | Status: AC
  Administered 2023-12-18: 5 mL
  Filled 2023-12-18: qty 6

## 2023-12-19 ENCOUNTER — Ambulatory Visit: Payer: Self-pay | Admitting: Obstetrics and Gynecology

## 2023-12-19 LAB — SURGICAL PATHOLOGY

## 2023-12-20 ENCOUNTER — Other Ambulatory Visit: Payer: Self-pay

## 2023-12-20 MED ORDER — FLUZONE 0.5 ML IM SUSY
0.5000 mL | PREFILLED_SYRINGE | Freq: Once | INTRAMUSCULAR | 0 refills | Status: AC
Start: 2023-12-20 — End: 2023-12-21
  Filled 2023-12-20: qty 0.5, 1d supply, fill #0

## 2024-01-09 ENCOUNTER — Encounter: Payer: 59 | Admitting: Nurse Practitioner

## 2024-01-10 ENCOUNTER — Other Ambulatory Visit: Payer: Self-pay

## 2024-01-13 IMAGING — MR MR BREAST BILAT WO/W CM
4 of 11 series · 13 of 48 positions shown · IV contrast (10ml Gadavist)
Comparison: Previous exam(s).

CLINICAL DATA: 50-year-old female presenting for high risk
screening breast MRI.

EXAM:
BILATERAL BREAST MRI WITH AND WITHOUT CONTRAST
TECHNIQUE: Multiplanar, multisequence MR images of both breasts were obtained
prior to and following the intravenous administration of 10 ml of
Gadavist.

[Series 4: T1 · axial · B · 1.5mm · 1.11mm/px · z∈[-104,+135]mm · 4 of 155 slices shown (1 of 2)]
[im 1/155]
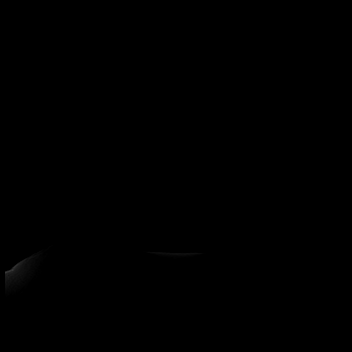
[im 52/155]
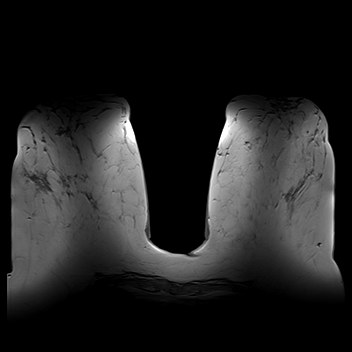
[im 103/155]
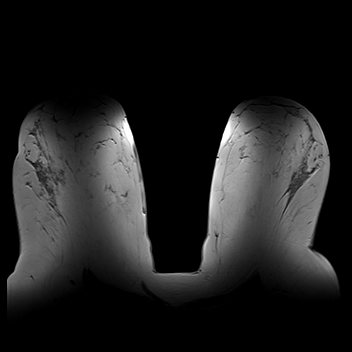
[im 155/155]
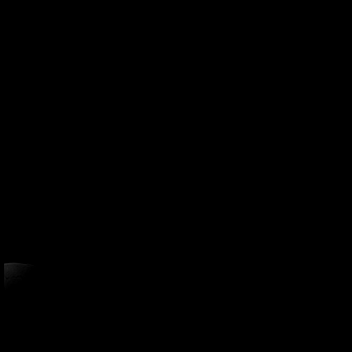

[Series 5: T2 · axial · B · 3.0mm · 1.11mm/px · z∈[-94,+132]mm · 2 of 61 slices shown (1 of 2)]
[im 1/61]
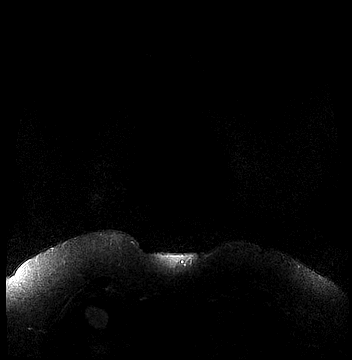
[im 61/61]
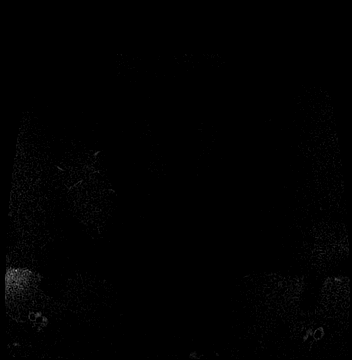

[Series 7: T1 · axial · B · 1.5mm · 1.11mm/px · z∈[-76,+162]mm · 5 of 160 slices shown (2 of 2)]
[im 1/160]
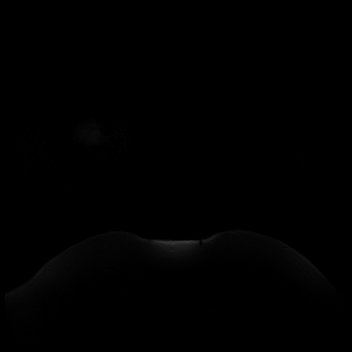
[im 40/160]
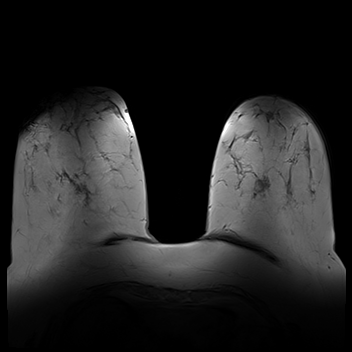
[im 80/160]
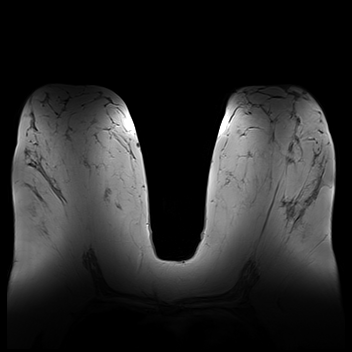
[im 120/160]
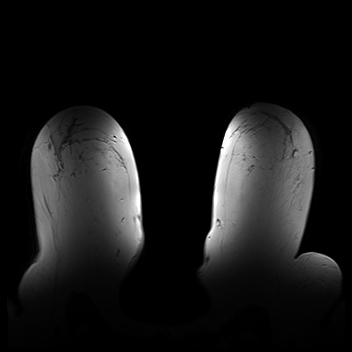
[im 160/160]
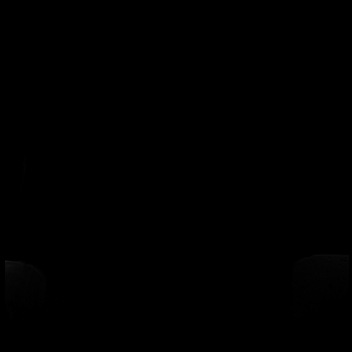

[Series 8: T2 · axial · B · 3.0mm · 1.11mm/px · z∈[-74,+153]mm · 2 of 61 slices shown (2 of 2)]
[im 1/61]
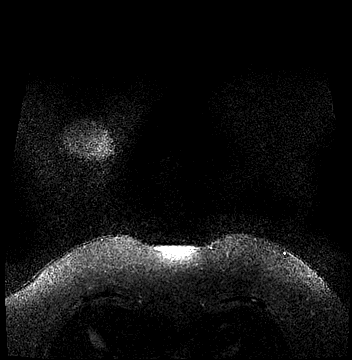
[im 61/61]
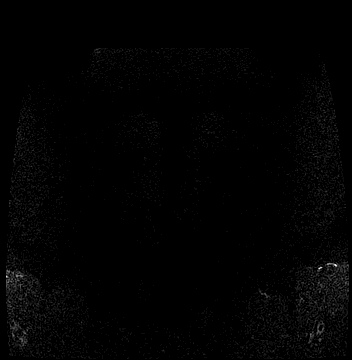

[13 of 48 positions shown; findings below may reference images not displayed]

Three-dimensional MR images were rendered by post-processing of the
original MR data on an independent workstation. The
three-dimensional MR images were interpreted, and findings are
reported in the following complete MRI report for this study. Three
dimensional images were evaluated at the independent interpreting
workstation using the DynaCAD thin client.

Please note, the study is slightly limited due to patient's body
habitus and breast positioning, leading to lack of uniform fat
saturation.
FINDINGS: Breast composition: b. Scattered fibroglandular tissue.

Background parenchymal enhancement: Mild.

Right breast: No mass or abnormal enhancement.

Left breast: No mass or abnormal enhancement.

Lymph nodes: No abnormal appearing lymph nodes.

Ancillary findings:  None.
IMPRESSION: No MRI evidence of malignancy in either breast.

RECOMMENDATION:
Routine annual screening. The patient is due for her next bilateral
mammogram in October 2021.

BI-RADS CATEGORY  1: Negative.

## 2024-01-16 ENCOUNTER — Other Ambulatory Visit: Payer: Self-pay

## 2024-01-16 MED ORDER — AMOXICILLIN-POT CLAVULANATE 875-125 MG PO TABS
1.0000 | ORAL_TABLET | Freq: Two times a day (BID) | ORAL | 0 refills | Status: DC
  Filled 2024-01-16: qty 10, 5d supply, fill #0

## 2024-01-23 DIAGNOSIS — D2261 Melanocytic nevi of right upper limb, including shoulder: Secondary | ICD-10-CM | POA: Diagnosis not present

## 2024-01-23 DIAGNOSIS — L570 Actinic keratosis: Secondary | ICD-10-CM | POA: Diagnosis not present

## 2024-01-23 DIAGNOSIS — D2361 Other benign neoplasm of skin of right upper limb, including shoulder: Secondary | ICD-10-CM | POA: Diagnosis not present

## 2024-01-23 DIAGNOSIS — D485 Neoplasm of uncertain behavior of skin: Secondary | ICD-10-CM | POA: Diagnosis not present

## 2024-01-23 DIAGNOSIS — L821 Other seborrheic keratosis: Secondary | ICD-10-CM | POA: Diagnosis not present

## 2024-01-23 DIAGNOSIS — D1801 Hemangioma of skin and subcutaneous tissue: Secondary | ICD-10-CM | POA: Diagnosis not present

## 2024-02-03 ENCOUNTER — Ambulatory Visit
Admission: RE | Admit: 2024-02-03 | Discharge: 2024-02-03 | Disposition: A | Discharge status: Home / Self Care | Source: Ambulatory Visit | Attending: Obstetrics and Gynecology | Admitting: Obstetrics and Gynecology

## 2024-02-03 DIAGNOSIS — Z803 Family history of malignant neoplasm of breast: Secondary | ICD-10-CM

## 2024-02-03 DIAGNOSIS — Z1239 Encounter for other screening for malignant neoplasm of breast: Secondary | ICD-10-CM

## 2024-02-03 DIAGNOSIS — C50919 Malignant neoplasm of unspecified site of unspecified female breast: Secondary | ICD-10-CM | POA: Diagnosis not present

## 2024-02-03 DIAGNOSIS — Z9189 Other specified personal risk factors, not elsewhere classified: Secondary | ICD-10-CM

## 2024-02-03 MED ORDER — GADOPICLENOL 0.5 MMOL/ML IV SOLN
10.0000 mL | Freq: Once | INTRAVENOUS | Status: AC | PRN
  Administered 2024-02-03: 10 mL via INTRAVENOUS

## 2024-03-13 ENCOUNTER — Ambulatory Visit: Payer: Self-pay

## 2024-03-13 ENCOUNTER — Other Ambulatory Visit: Payer: Self-pay

## 2024-03-13 ENCOUNTER — Ambulatory Visit

## 2024-03-13 VITALS — BP 114/84 | HR 83 | Temp 98.3°F | Ht 68.0 in | Wt 272.8 lb

## 2024-03-13 DIAGNOSIS — R7303 Prediabetes: Secondary | ICD-10-CM | POA: Diagnosis not present

## 2024-03-13 DIAGNOSIS — Z130 Encounter for screening for diseases of the blood and blood-forming organs and certain disorders involving the immune mechanism: Secondary | ICD-10-CM

## 2024-03-13 DIAGNOSIS — Z1211 Encounter for screening for malignant neoplasm of colon: Secondary | ICD-10-CM | POA: Diagnosis not present

## 2024-03-13 DIAGNOSIS — J3089 Other allergic rhinitis: Secondary | ICD-10-CM | POA: Diagnosis not present

## 2024-03-13 DIAGNOSIS — Z6841 Body Mass Index (BMI) 40.0 and over, adult: Secondary | ICD-10-CM | POA: Diagnosis not present

## 2024-03-13 DIAGNOSIS — E66813 Obesity, class 3: Secondary | ICD-10-CM

## 2024-03-13 DIAGNOSIS — Z87440 Personal history of urinary (tract) infections: Secondary | ICD-10-CM

## 2024-03-13 DIAGNOSIS — D75839 Thrombocytosis, unspecified: Secondary | ICD-10-CM | POA: Diagnosis not present

## 2024-03-13 DIAGNOSIS — R0683 Snoring: Secondary | ICD-10-CM | POA: Diagnosis not present

## 2024-03-13 DIAGNOSIS — E781 Pure hyperglyceridemia: Secondary | ICD-10-CM | POA: Diagnosis not present

## 2024-03-13 DIAGNOSIS — Z1329 Encounter for screening for other suspected endocrine disorder: Secondary | ICD-10-CM

## 2024-03-13 DIAGNOSIS — R8271 Bacteriuria: Secondary | ICD-10-CM | POA: Insufficient documentation

## 2024-03-13 LAB — URINALYSIS, ROUTINE W REFLEX MICROSCOPIC
Bilirubin Urine: NEGATIVE
Hgb urine dipstick: NEGATIVE
Ketones, ur: NEGATIVE
Leukocytes,Ua: NEGATIVE
Nitrite: NEGATIVE
Specific Gravity, Urine: 1.025 (ref 1.000–1.030)
Total Protein, Urine: NEGATIVE
Urine Glucose: NEGATIVE
Urobilinogen, UA: 0.2 (ref 0.0–1.0)
pH: 6 (ref 5.0–8.0)

## 2024-03-13 LAB — COMPREHENSIVE METABOLIC PANEL WITH GFR
ALT: 18 U/L (ref 3–35)
AST: 17 U/L (ref 5–37)
Albumin: 4.1 g/dL (ref 3.5–5.2)
Alkaline Phosphatase: 95 U/L (ref 39–117)
BUN: 12 mg/dL (ref 6–23)
CO2: 31 meq/L (ref 19–32)
Calcium: 9.4 mg/dL (ref 8.4–10.5)
Chloride: 101 meq/L (ref 96–112)
Creatinine, Ser: 0.8 mg/dL (ref 0.40–1.20)
GFR: 83.55 mL/min (ref >60.00)
Glucose, Bld: 95 mg/dL (ref 70–99)
Potassium: 4.7 meq/L (ref 3.5–5.1)
Sodium: 138 meq/L (ref 135–145)
Total Bilirubin: 0.3 mg/dL (ref 0.2–1.2)
Total Protein: 6.9 g/dL (ref 6.0–8.3)

## 2024-03-13 LAB — CBC
HCT: 37.5 % (ref 36.0–46.0)
Hemoglobin: 12.6 g/dL (ref 12.0–15.0)
MCHC: 33.6 g/dL (ref 30.0–36.0)
MCV: 79.3 fl (ref 78.0–100.0)
Platelets: 396 K/uL (ref 150.0–400.0)
RBC: 4.73 Mil/uL (ref 3.87–5.11)
RDW: 14.5 % (ref 11.5–15.5)
WBC: 7.1 K/uL (ref 4.0–10.5)

## 2024-03-13 LAB — HEMOGLOBIN A1C: Hgb A1c MFr Bld: 6 % (ref 4.6–6.5)

## 2024-03-13 LAB — LIPID PANEL
Cholesterol: 222 mg/dL — ABNORMAL HIGH (ref 28–200)
HDL: 36.1 mg/dL — ABNORMAL LOW (ref >39.00)
LDL Cholesterol: 138 mg/dL — ABNORMAL HIGH (ref 10–99)
NonHDL: 186.05
Total CHOL/HDL Ratio: 6
Triglycerides: 242 mg/dL — ABNORMAL HIGH (ref 10.0–149.0)
VLDL: 48.4 mg/dL — ABNORMAL HIGH (ref 0.0–40.0)

## 2024-03-13 LAB — VITAMIN D 25 HYDROXY (VIT D DEFICIENCY, FRACTURES): VITD: 55.8 ng/mL (ref 30.00–100.00)

## 2024-03-13 MED ORDER — URIBEL 118 MG PO CAPS
118.0000 mg | ORAL_CAPSULE | Freq: Every day | ORAL | 3 refills | Status: AC | PRN
  Filled 2024-03-13: qty 90, 90d supply, fill #0

## 2024-03-13 MED ORDER — NITROFURANTOIN MONOHYD MACRO 100 MG PO CAPS
100.0000 mg | ORAL_CAPSULE | Freq: Every day | ORAL | 1 refills | Status: AC | PRN
  Filled 2024-03-13: qty 30, 30d supply, fill #0

## 2024-03-13 NOTE — Assessment & Plan Note (Addendum)
 Difficulty losing weight. Prefers to avoid weight loss medications due to potential side effects.  Encouraged lifestyle modifications including a Mediterranean diet and increased physical activity. Check vitamin D  level today.  Orders:   Vitamin D  (25 hydroxy)

## 2024-03-13 NOTE — Assessment & Plan Note (Addendum)
 Repeat A1c today. Encouraged lifestyle modifications to improve blood glucose levels. Orders:   HgB A1c

## 2024-03-13 NOTE — Assessment & Plan Note (Signed)
 Stable on current treatment. Continue nasal flonase  and Loratadine.

## 2024-03-13 NOTE — Patient Instructions (Signed)
 Diet: Emphasize whole grains, lean proteins, fruits, and vegetables. Limit processed foods and sugary drinks. Exercise: Aim for 150 minutes of moderate aerobic activity weekly plus strength training twice a week.

## 2024-03-13 NOTE — Assessment & Plan Note (Addendum)
 Stable. Check CBC to monitor platelet count. Given h/o snoring referral to sleep specialist for potential OSA done today.  Orders:   CBC

## 2024-03-13 NOTE — Assessment & Plan Note (Addendum)
 Family h/o colon cancer in her father. Last colonoscopy in 02/11/2019 Susan Pierre, DO at [facility]), repeat after 5 yrs due to family history. Referral for to [facility] for repeat colonoscopy done.  Orders:   Ambulatory referral to General Surgery

## 2024-03-13 NOTE — Progress Notes (Signed)
 The 10-year ASCVD risk score (Arnett DK, et al., 2019) is: 2.5%   Values used to calculate the score:     Age: 53 years     Clinically relevant sex: Female     Is Non-Hispanic African American: No     Diabetic: No     Tobacco smoker: No     Systolic Blood Pressure: 114 mmHg     Is BP treated: No     HDL Cholesterol: 36.1 mg/dL     Total Cholesterol: 222 mg/dL

## 2024-03-13 NOTE — Assessment & Plan Note (Addendum)
 Recurrent h/o UTI, stable with Nitrofurantoin  100 mg after intercourse and prn Uribel  180 mg capsule.  Last UTI 9/25. Addressed antibiotic resistance risks. Check UA today.  Prescribed nitrofurantoin  100 mg, take one tablet within 2 hours of intercourse (30 tablets with 1 refill) for UTI prophylaxis. Continue Uribel  180 mg daily prn as needed for urinary symptoms. Recommend staying hydrated.  Recommend referral to urologist for evaluation and potential use of vaginal estrogen cream. Patient to reach out if recurrent UTI despite above treatment measures.  Orders:   nitrofurantoin , macrocrystal-monohydrate, (MACROBID ) 100 MG capsule; Take 1 capsule (100 mg total) by mouth daily as needed.   Meth-Hyo-M Bl-Na Phos-Ph Sal (URIBEL ) 118 MG CAPS; Take 1 capsule (118 mg total) by mouth daily as needed.   Urinalysis, Routine w reflex microscopic

## 2024-03-13 NOTE — Assessment & Plan Note (Addendum)
 STOP-BANG 4, mild thrombocytosis.  Discussed risks of untreated sleep apnea including atrial fibrillation, hypertension, and fatigue.  Referred to sleep specialist for evaluation. Orders:   Ambulatory referral to Pulmonology

## 2024-03-13 NOTE — Progress Notes (Signed)
 Established Patient Office Visit TOC from Dr. Maribeth    Subjective  Patient ID: Camillo GORMAN [name], female    DOB: 01-Mar-1971  Age: 53 y.o. MRN: [id number]  Chief Complaint  Patient presents with   Establish Care   Medication Refill    Discussed the use of AI scribe software for clinical note transcription with the patient, who gave verbal consent to proceed.  History of Present Illness Janeva Peaster Dittrich is a 53 year old female who presents for transfer of care from previous PCP and chronic follow up.   H/O recurrent urinary tract infections: She has a history of recurrent urinary tract infections (UTIs) since childhood, with the most recent episode occurring in September 2025. She uses Uribel  180 mg and Nitrofurantoin  100 mg after sexual intercourse. UTIs can sometimes occur after intercourse, though this is less frequent now than in the past.    S/P left rotator cuff surgery:  S/P left shoulder arthroscopic surgery, followed p with Dr. Edie at [facility], last visit on 10/16/23, f/u in prn basis recommended. She reports improved function post-surgery, though she occasionally uses ibuprofen for shoulder discomfort.  Perennial allergic rhinitis: Using Flonase  and loratadine daily.   She also takes CoQ10, vitamin D , and a multivitamin regularly.  Snoring, thrombocytosis, obesity, prediabetes, elevated triglycerides and LDL cholesterol:   She reports snoring at night, as noted by her husband, and occasionally feels tired during the day. No episodes of waking up holding her breath or choking have been noted, and she has not been evaluated for sleep apnea. Has a h/o mild thrombocytosis which has been monitored with intermittent CBC check.   Her blood glucose levels have been elevated, with an A1c of 6.1. She has difficulty managing her weight, attributing some challenges to a lack of motivation and possible pre-menopausal changes. Her diet is generally healthy during the day but less  controlled in the evenings, often eating out with her husband.    Family h/o breast cancer in her mom and colon cancer in her father.   Breast cancer screening: Annual mammogram recommended in 10/2024 after undergoing MR breast bilateral in 02/03/24.   Colorectal cancer screening: Last colonoscopy: 02/11/2019 Harlee Pierre, DO at [facility]), repeat after 5 yrs due to [employer] colon cancer.   Following up with ob/gyn:  Has Mirena  IUD (12/23, 8 yrs indication) in place for monorrhigia history.      ROS As per HPI    Objective:     BP 114/84 (BP Location: Right Arm, Patient Position: Sitting, Cuff Size: Normal)   Pulse 83   Temp 98.3 F (36.8 C) (Oral)   Ht 5' 8 (1.727 m)   Wt 272 lb 12.8 oz (123.7 kg)   SpO2 98%   BMI 41.48 kg/m      03/13/2024    8:58 AM 01/04/2023   11:46 AM 12/27/2021    8:25 AM  Depression screen PHQ 2/9  Decreased Interest 0 0 0  Down, Depressed, Hopeless 0 0 0  PHQ - 2 Score 0 0 0  Altered sleeping 0 1   Tired, decreased energy 0 0   Change in appetite 0 0   Feeling bad or failure about yourself  0 0   Trouble concentrating 0 0   Moving slowly or fidgety/restless 0 0   Suicidal thoughts 0 0   PHQ-9 Score 0 1    Difficult doing work/chores Not difficult at all Not difficult at all      Data saved  with a previous flowsheet row definition      03/13/2024    8:57 AM 01/04/2023   11:46 AM  GAD 7 : Generalized Anxiety Score  Nervous, Anxious, on Edge 0 0  Control/stop worrying 0 0  Worry too much - different things 0 0  Trouble relaxing 0 0  Restless 0 0  Easily annoyed or irritable 0 0  Afraid - awful might happen 0 0  Total GAD 7 Score 0 0  Anxiety Difficulty Not difficult at all Not difficult at all      03/13/2024    8:58 AM 01/04/2023   11:46 AM 12/27/2021    8:25 AM  Depression screen PHQ 2/9  Decreased Interest 0 0 0  Down, Depressed, Hopeless 0 0 0  PHQ - 2 Score 0 0 0  Altered sleeping 0 1   Tired, decreased energy 0 0    Change in appetite 0 0   Feeling bad or failure about yourself  0 0   Trouble concentrating 0 0   Moving slowly or fidgety/restless 0 0   Suicidal thoughts 0 0   PHQ-9 Score 0 1    Difficult doing work/chores Not difficult at all Not difficult at all      Data saved with a previous flowsheet row definition      03/13/2024    8:57 AM 01/04/2023   11:46 AM  GAD 7 : Generalized Anxiety Score  Nervous, Anxious, on Edge 0 0  Control/stop worrying 0 0  Worry too much - different things 0 0  Trouble relaxing 0 0  Restless 0 0  Easily annoyed or irritable 0 0  Afraid - awful might happen 0 0  Total GAD 7 Score 0 0  Anxiety Difficulty Not difficult at all Not difficult at all   SDOH Screenings   Food Insecurity: No Food Insecurity (03/12/2024)  Housing: Unknown (03/12/2024)  Transportation Needs: No Transportation Needs (03/12/2024)  Utilities: Not At Risk (10/16/2023)   Received from [facility]  Alcohol Screen: Low Risk (03/12/2024)  Depression (PHQ2-9): Low Risk (03/13/2024)  Financial Resource Strain: Low Risk (03/12/2024)  Physical Activity: Insufficiently Active (03/12/2024)  Social Connections: Moderately Isolated (03/12/2024)  Stress: No Stress Concern Present (03/12/2024)  Tobacco Use: Low Risk (03/13/2024)     Physical Exam Constitutional:      General: She is not in acute distress.    Appearance: Normal appearance. She is obese.  HENT:     Head: Normocephalic and atraumatic.     Right Ear: Tympanic membrane normal.     Left Ear: Tympanic membrane normal.     Mouth/Throat:     Mouth: Mucous membranes are moist.  Cardiovascular:     Rate and Rhythm: Normal rate.  Pulmonary:     Effort: Pulmonary effort is normal.     Breath sounds: Normal breath sounds. No wheezing.  Abdominal:     General: Abdomen is protuberant. Bowel sounds are normal.     Palpations: Abdomen is soft.     Tenderness: There is no abdominal tenderness. There is no  guarding.  Musculoskeletal:     Cervical back: Normal range of motion and neck supple. No rigidity.     Right lower leg: No edema.     Left lower leg: No edema.  Skin:    General: Skin is warm.  Neurological:     Mental Status: She is alert and oriented to person, place, and time.  Psychiatric:  Mood and Affect: Mood normal.        No results found for any visits on 03/13/24.  The 10-year ASCVD risk score (Arnett DK, et al., 2019) is: 2.3%     Assessment & Plan:   Patient is a pleasant 53 year old female presenting for chronic follow up. Discussed pneumonia vaccination due to occupational exposure and weight status, she can update this through local pharmacy.  Assessment & Plan History of recurrent UTI (urinary tract infection) Recurrent h/o UTI, stable with Nitrofurantoin  100 mg after intercourse and prn Uribel  180 mg capsule.  Last UTI 9/25. Addressed antibiotic resistance risks. Check UA today.  Prescribed nitrofurantoin  100 mg, take one tablet within 2 hours of intercourse (30 tablets with 1 refill) for UTI prophylaxis. Continue Uribel  180 mg daily prn as needed for urinary symptoms. Recommend staying hydrated.  Recommend referral to urologist for evaluation and potential use of vaginal estrogen cream. Patient to reach out if recurrent UTI despite above treatment measures.  Orders:   nitrofurantoin , macrocrystal-monohydrate, (MACROBID ) 100 MG capsule; Take 1 capsule (100 mg total) by mouth daily as needed.   Meth-Hyo-M Bl-Na Phos-Ph Sal (URIBEL ) 118 MG CAPS; Take 1 capsule (118 mg total) by mouth daily as needed.   Urinalysis, Routine w reflex microscopic  Snoring STOP-BANG 4, mild thrombocytosis.  Discussed risks of untreated sleep apnea including atrial fibrillation, hypertension, and fatigue.  Referred to sleep specialist for evaluation. Orders:   Ambulatory referral to Pulmonology  Prediabetes Repeat A1c today. Encouraged lifestyle modifications to improve  blood glucose levels. Orders:   HgB A1c  Hypertriglyceridemia Elevated triglycerides and LDL, not requiring medication so far based on previous lipid panel. Repeat fasting lipid panel and CMP today.  Discussed lifestyle modifications.  Orders:   Lipid panel   Comp Met (CMET)  Thrombocytosis Stable. Check CBC to monitor platelet count. Given h/o snoring referral to sleep specialist for potential OSA done today.  Orders:   CBC  Class 3 severe obesity without serious comorbidity with body mass index (BMI) of 40.0 to 44.9 in adult, unspecified obesity type (HCC) Difficulty losing weight. Prefers to avoid weight loss medications due to potential side effects.  Encouraged lifestyle modifications including a Mediterranean diet and increased physical activity. Check vitamin D  level today.  Orders:   Vitamin D  (25 hydroxy)  Encounter for screening colonoscopy Family h/o colon cancer in her father. Last colonoscopy in 02/11/2019 Harlee Pierre, DO at [facility]), repeat after 5 yrs due to family history. Referral for to [facility] for repeat colonoscopy done.  Orders:   Ambulatory referral to General Surgery  Non-seasonal allergic rhinitis, unspecified trigger Stable on current treatment. Continue nasal flonase  and Loratadine.     I personally spent a total of 40 minutes in the care of the patient today including preparing to see the patient, getting/reviewing separately obtained history, performing a medically appropriate exam/evaluation, counseling and educating, placing orders, referring and communicating with other health care professionals, documenting clinical information in the EHR, independently interpreting results, and communicating results.  Return in about 1 year (around 03/13/2025) for Chronic follow up .   Luke Shade, MD

## 2024-03-13 NOTE — Assessment & Plan Note (Addendum)
 Elevated triglycerides and LDL, not requiring medication so far based on previous lipid panel. Repeat fasting lipid panel and CMP today.  Discussed lifestyle modifications.  Orders:   Lipid panel   Comp Met (CMET)

## 2024-03-14 ENCOUNTER — Other Ambulatory Visit: Payer: Self-pay

## 2024-03-14 MED ORDER — PREVNAR 20 0.5 ML IM SUSY
0.5000 mL | PREFILLED_SYRINGE | Freq: Once | INTRAMUSCULAR | 0 refills | Status: AC
  Filled 2024-03-14: qty 0.5, 1d supply, fill #0

## 2024-03-27 ENCOUNTER — Ambulatory Visit: Admitting: Sleep Medicine

## 2024-03-27 ENCOUNTER — Encounter: Payer: Self-pay | Admitting: Sleep Medicine

## 2024-03-27 DIAGNOSIS — G4733 Obstructive sleep apnea (adult) (pediatric): Secondary | ICD-10-CM | POA: Diagnosis not present

## 2024-03-27 DIAGNOSIS — Z6841 Body Mass Index (BMI) 40.0 and over, adult: Secondary | ICD-10-CM

## 2024-03-27 NOTE — Patient Instructions (Signed)
 SABRA

## 2024-03-27 NOTE — Progress Notes (Signed)
 "      Name:Susan Bentley MRN: [id number] DOB: 14-Mar-1971   CHIEF COMPLAINT:  EXCESSIVE DAYTIME SLEEPINESS   HISTORY OF PRESENT ILLNESS: Susan Bentley is a 53 y.o. w/ a h/o morbid obesity who present for c/o loud snoring and excessive daytime sleepiness which has been present for several years. Reports nocturnal awakenings due to nocturia, however does not have difficulty falling back to sleep. Reports significant weight changes. Admits to dry mouth. Denies morning headaches, RLS symptoms, dream enactment, cataplexy, hypnagogic or hypnapompic hallucinations. Reports a family history of sleep apnea. Denies drowsy driving. Drinks 1-2 sodas weekly, occasional alcohol use, denies tobacco or illicit drug use.   Bedtime 10 pm Sleep onset 10 mins Rise time 6 am   EPWORTH SLEEP SCORE 10    03/27/2024    9:00 AM  Results of the Epworth flowsheet  Sitting and reading 1  Watching TV 2  Sitting, inactive in a public place (e.g. a theatre or a meeting) 1  As a passenger in a car for an hour without a break 2  Lying down to rest in the afternoon when circumstances permit 3  Sitting and talking to someone 0  Sitting quietly after a lunch without alcohol 1  In a car, while stopped for a few minutes in traffic 0  Total score 10    PAST MEDICAL HISTORY :   has a past medical history of Allergy (03/28/1982), BRCA negative (10/2019), COVID-19, Elevated uric acid in blood, Family history of breast cancer (10/2019), GERD (gastroesophageal reflux disease) (03/28/1992), History of abnormal cervical Pap smear, History of mammogram (10/13/2014), History of Papanicolaou smear of cervix (10/13/2014), History of recurrent UTIs, Hypertriglyceridemia, Impingement syndrome of shoulder region, Increased risk of breast cancer (10/2019), Migraine, Nontraumatic incomplete tear of left rotator cuff (06/02/2023), Pre-diabetes, Tendinitis of upper biceps tendon of left shoulder (06/02/2023), and UTI (lower urinary tract  infection).  has a past surgical history that includes Septoplasty (2000); Colposcopy (12/02/2011); Intrauterine device (iud) insertion; Colonoscopy; Shoulder arthroscopy with subacromial decompression, rotator cuff repair and bicep tendon repair (Left, 04/18/2023); and Breast biopsy (Left, 12/18/2023). Prior to Admission medications  Medication Sig Start Date End Date Taking? Authorizing Provider  Coenzyme Q10 (COQ10 PO) Take 1 tablet by mouth daily.   Yes [provider]  fluticasone  (FLONASE ) 50 MCG/ACT nasal spray Place 2 sprays into both nostrils daily.   Yes [provider]  ibuprofen (ADVIL) 200 MG tablet Take 400 mg by mouth every 6 (six) hours as needed for moderate pain (pain score 4-6).   Yes [provider]  levonorgestrel  (MIRENA ) 20 MCG/DAY IUD 1 each by Intrauterine route once. 03/03/22  Yes [provider]  Loratadine 10 MG CAPS Take 1 tablet by mouth daily.   Yes [provider]  Meth-Hyo-M Bl-Na Phos-Ph Sal (URIBEL ) 118 MG CAPS Take 1 capsule (118 mg total) by mouth daily as needed. 03/13/24  Yes Bair, Kalpana, MD  Multiple Vitamin (MULTIVITAMIN WITH MINERALS) TABS tablet Take 1 tablet by mouth daily.   Yes [provider]  nitrofurantoin , macrocrystal-monohydrate, (MACROBID ) 100 MG capsule Take 1 capsule (100 mg total) by mouth daily as needed. 03/13/24  Yes Bair, Luke, MD  VITAMIN D  PO Take 1 capsule by mouth daily.   Yes [provider]   Allergies[1]  FAMILY HISTORY:  family history includes Alzheimer's disease in her mother; Aneurysm in her paternal grandfather; Arthritis in her father and mother; Breast cancer (age of onset: 22) in her mother; Cancer in her  father and mother; Cancer (age of onset: 35) in her maternal grandmother; Cancer (age of onset: 62) in an other family member; Cancer (age of onset: 52) in her paternal grandmother; Diabetes in her mother; Hearing loss in her father; Heart disease in her father  and paternal grandfather; Heart disease (age of onset: 12) in her maternal grandfather and paternal grandmother; Hyperlipidemia in her father, paternal grandfather, and paternal grandmother; Hypertension in her father, paternal grandfather, and paternal grandmother; Hypothyroidism in her father and sister; Miscarriages / Stillbirths in her mother; Obesity in her father; Stroke in her paternal grandfather. SOCIAL HISTORY:  reports that she has never smoked. She has never used smokeless tobacco. She reports current alcohol use. She reports that she does not use drugs.   Review of Systems:  Gen:  Denies  fever, sweats, chills weight loss  HEENT: Denies blurred vision, double vision, ear pain, eye pain, hearing loss, nose bleeds, sore throat Cardiac:  No dizziness, chest pain or heaviness, chest tightness,edema, No JVD Resp:   No cough, -sputum production, -shortness of breath,-wheezing, -hemoptysis,  Gi: Denies swallowing difficulty, stomach pain, nausea or vomiting, diarrhea, constipation, bowel incontinence Gu:  Denies bladder incontinence, burning urine Ext:   Denies Joint pain, stiffness or swelling Skin: Denies  skin rash, easy bruising or bleeding or hives Endoc:  Denies polyuria, polydipsia , polyphagia or weight change Psych:   Denies depression, insomnia or hallucinations  Other:  All other systems negative  VITAL SIGNS: BP 100/70   Pulse 95   Temp 98.2 F (36.8 C)   Ht 5' 8 (1.727 m)   Wt 274 lb 6.4 oz (124.5 kg)   LMP  (LMP Unknown)   SpO2 95%   BMI 41.72 kg/m    Physical Examination:   General Appearance: No distress  EYES PERRLA, EOM intact.   NECK Supple, No JVD Pulmonary: normal breath sounds, No wheezing.  CardiovascularNormal S1,S2.  No m/r/g.   Abdomen: Benign, Soft, non-tender. Skin:   warm, no rashes, no ecchymosis  Extremities: normal, no cyanosis, clubbing. Neuro:without focal findings,  speech normal  PSYCHIATRIC: Mood, affect within normal  limits.   ASSESSMENT AND PLAN  OSA I suspect that OSA is likely present due to clinical presentation. Discussed the consequences of untreated sleep apnea. Advised not to drive drowsy for safety of patient and others. Will complete further evaluation with a home sleep study and follow up to review results.    Morbid obesity Counseled patient on diet and lifestyle modification.    MEDICATION ADJUSTMENTS/LABS AND TESTS ORDERED: Recommend Sleep Study   Patient  satisfied with Plan of action and management. All questions answered  Follow up to review HST results and treatment plan.   I spent a total of 47 minutes reviewing chart data, face-to-face evaluation with the patient, counseling and coordination of care as detailed above.    Caide Campi, M.D.  Sleep Medicine [facility]           [1]  Allergies Allergen Reactions   Codeine Hives   Sulfa Antibiotics Other (See Comments)    Dizzy and lightheadedness   "

## 2024-04-08 ENCOUNTER — Encounter

## 2024-04-08 DIAGNOSIS — G4733 Obstructive sleep apnea (adult) (pediatric): Secondary | ICD-10-CM

## 2024-04-12 DIAGNOSIS — G4733 Obstructive sleep apnea (adult) (pediatric): Secondary | ICD-10-CM | POA: Diagnosis not present

## 2024-04-25 ENCOUNTER — Encounter: Payer: Self-pay | Admitting: Sleep Medicine

## 2024-04-25 ENCOUNTER — Ambulatory Visit: Payer: Self-pay

## 2024-04-25 DIAGNOSIS — G4733 Obstructive sleep apnea (adult) (pediatric): Secondary | ICD-10-CM

## 2024-04-25 NOTE — Telephone Encounter (Signed)
 Pt notified. NFN

## 2025-03-14 ENCOUNTER — Encounter

## 2025-03-14 ENCOUNTER — Encounter: Admitting: Nurse Practitioner
